# Patient Record
Sex: Female | Born: 1980
Health system: Southern US, Community
[De-identification: ages and names within clinical notes are randomized; demographics above are authoritative.]

## PROBLEM LIST (undated history)

## (undated) ENCOUNTER — Inpatient Hospital Stay (HOSPITAL_COMMUNITY): Payer: Self-pay

## (undated) DIAGNOSIS — N200 Calculus of kidney: Secondary | ICD-10-CM

## (undated) DIAGNOSIS — Z9889 Other specified postprocedural states: Secondary | ICD-10-CM

## (undated) DIAGNOSIS — O039 Complete or unspecified spontaneous abortion without complication: Principal | ICD-10-CM

## (undated) DIAGNOSIS — R112 Nausea with vomiting, unspecified: Secondary | ICD-10-CM

## (undated) DIAGNOSIS — Z3490 Encounter for supervision of normal pregnancy, unspecified, unspecified trimester: Secondary | ICD-10-CM

## (undated) DIAGNOSIS — N739 Female pelvic inflammatory disease, unspecified: Secondary | ICD-10-CM

## (undated) DIAGNOSIS — O468X1 Other antepartum hemorrhage, first trimester: Secondary | ICD-10-CM

## (undated) DIAGNOSIS — R1031 Right lower quadrant pain: Secondary | ICD-10-CM

## (undated) DIAGNOSIS — F419 Anxiety disorder, unspecified: Secondary | ICD-10-CM

## (undated) DIAGNOSIS — J069 Acute upper respiratory infection, unspecified: Secondary | ICD-10-CM

## (undated) DIAGNOSIS — R102 Pelvic and perineal pain: Secondary | ICD-10-CM

## (undated) DIAGNOSIS — O418X10 Other specified disorders of amniotic fluid and membranes, first trimester, not applicable or unspecified: Secondary | ICD-10-CM

## (undated) HISTORY — PX: SHOULDER SURGERY: SHX246

## (undated) HISTORY — DX: Acute upper respiratory infection, unspecified: J06.9

## (undated) HISTORY — DX: Encounter for supervision of normal pregnancy, unspecified, unspecified trimester: Z34.90

## (undated) HISTORY — DX: Other antepartum hemorrhage, first trimester: O46.8X1

## (undated) HISTORY — DX: Other specified disorders of amniotic fluid and membranes, first trimester, not applicable or unspecified: O41.8X10

## (undated) HISTORY — PX: BACK SURGERY: SHX140

## (undated) HISTORY — DX: Female pelvic inflammatory disease, unspecified: N73.9

## (undated) HISTORY — DX: Pelvic and perineal pain: R10.2

## (undated) HISTORY — DX: Complete or unspecified spontaneous abortion without complication: O03.9

## (undated) HISTORY — PX: TONSILLECTOMY: SUR1361

## (undated) HISTORY — DX: Right lower quadrant pain: R10.31

---

## 2000-10-28 ENCOUNTER — Emergency Department (HOSPITAL_COMMUNITY): Admission: EM | Admit: 2000-10-28 | Discharge: 2000-10-28 | Payer: Self-pay | Admitting: *Deleted

## 2001-11-20 ENCOUNTER — Emergency Department (HOSPITAL_COMMUNITY): Admission: EM | Admit: 2001-11-20 | Discharge: 2001-11-21 | Payer: Self-pay | Admitting: Emergency Medicine

## 2002-08-03 ENCOUNTER — Emergency Department (HOSPITAL_COMMUNITY): Admission: EM | Admit: 2002-08-03 | Discharge: 2002-08-03 | Payer: Self-pay | Admitting: Emergency Medicine

## 2003-01-29 ENCOUNTER — Emergency Department (HOSPITAL_COMMUNITY): Admission: EM | Admit: 2003-01-29 | Discharge: 2003-01-29 | Payer: Self-pay | Admitting: Internal Medicine

## 2003-08-10 ENCOUNTER — Ambulatory Visit (HOSPITAL_COMMUNITY): Admission: RE | Admit: 2003-08-10 | Discharge: 2003-08-10 | Payer: Self-pay | Admitting: *Deleted

## 2003-08-31 ENCOUNTER — Observation Stay (HOSPITAL_COMMUNITY): Admission: RE | Admit: 2003-08-31 | Discharge: 2003-09-01 | Payer: Self-pay | Admitting: *Deleted

## 2003-09-07 ENCOUNTER — Ambulatory Visit (HOSPITAL_COMMUNITY): Admission: RE | Admit: 2003-09-07 | Discharge: 2003-09-07 | Payer: Self-pay | Admitting: Internal Medicine

## 2003-10-24 ENCOUNTER — Ambulatory Visit (HOSPITAL_COMMUNITY): Admission: RE | Admit: 2003-10-24 | Discharge: 2003-10-24 | Payer: Self-pay | Admitting: *Deleted

## 2003-11-17 ENCOUNTER — Ambulatory Visit (HOSPITAL_COMMUNITY): Admission: AD | Admit: 2003-11-17 | Discharge: 2003-11-17 | Payer: Self-pay | Admitting: *Deleted

## 2003-11-21 ENCOUNTER — Ambulatory Visit (HOSPITAL_COMMUNITY): Admission: AD | Admit: 2003-11-21 | Discharge: 2003-11-21 | Payer: Self-pay | Admitting: *Deleted

## 2003-11-27 ENCOUNTER — Inpatient Hospital Stay (HOSPITAL_COMMUNITY): Admission: AD | Admit: 2003-11-27 | Discharge: 2003-11-29 | Payer: Self-pay | Admitting: *Deleted

## 2004-05-28 ENCOUNTER — Encounter (HOSPITAL_COMMUNITY): Admission: RE | Admit: 2004-05-28 | Discharge: 2004-06-27 | Payer: Self-pay | Admitting: Neurosurgery

## 2004-07-31 ENCOUNTER — Ambulatory Visit (HOSPITAL_COMMUNITY): Admission: RE | Admit: 2004-07-31 | Discharge: 2004-08-01 | Payer: Self-pay | Admitting: Neurosurgery

## 2004-08-29 ENCOUNTER — Ambulatory Visit (HOSPITAL_COMMUNITY): Admission: RE | Admit: 2004-08-29 | Discharge: 2004-08-29 | Payer: Self-pay | Admitting: Family Medicine

## 2004-12-11 ENCOUNTER — Encounter: Admission: RE | Admit: 2004-12-11 | Discharge: 2004-12-11 | Payer: Self-pay | Admitting: Family Medicine

## 2006-01-24 ENCOUNTER — Ambulatory Visit (HOSPITAL_COMMUNITY): Admission: RE | Admit: 2006-01-24 | Discharge: 2006-01-24 | Payer: Self-pay | Admitting: Family Medicine

## 2007-09-10 ENCOUNTER — Ambulatory Visit (HOSPITAL_COMMUNITY): Admission: RE | Admit: 2007-09-10 | Discharge: 2007-09-10 | Payer: Self-pay | Admitting: Family Medicine

## 2008-03-13 ENCOUNTER — Other Ambulatory Visit: Admission: RE | Admit: 2008-03-13 | Discharge: 2008-03-13 | Payer: Self-pay | Admitting: Obstetrics & Gynecology

## 2008-08-19 ENCOUNTER — Ambulatory Visit: Payer: Self-pay | Admitting: Obstetrics & Gynecology

## 2008-08-19 ENCOUNTER — Inpatient Hospital Stay (HOSPITAL_COMMUNITY): Admission: AD | Admit: 2008-08-19 | Discharge: 2008-08-19 | Payer: Self-pay | Admitting: Obstetrics & Gynecology

## 2008-09-20 ENCOUNTER — Inpatient Hospital Stay (HOSPITAL_COMMUNITY): Admission: RE | Admit: 2008-09-20 | Discharge: 2008-09-22 | Payer: Self-pay | Admitting: Obstetrics & Gynecology

## 2008-09-20 ENCOUNTER — Ambulatory Visit: Payer: Self-pay | Admitting: Family

## 2008-09-28 ENCOUNTER — Inpatient Hospital Stay (HOSPITAL_COMMUNITY): Admission: AD | Admit: 2008-09-28 | Discharge: 2008-09-28 | Payer: Self-pay | Admitting: Obstetrics & Gynecology

## 2009-04-14 ENCOUNTER — Emergency Department (HOSPITAL_COMMUNITY): Admission: EM | Admit: 2009-04-14 | Discharge: 2009-04-14 | Payer: Self-pay | Admitting: Emergency Medicine

## 2009-10-01 ENCOUNTER — Other Ambulatory Visit: Admission: RE | Admit: 2009-10-01 | Discharge: 2009-10-01 | Payer: Self-pay | Admitting: Obstetrics and Gynecology

## 2009-10-18 ENCOUNTER — Emergency Department (HOSPITAL_COMMUNITY): Admission: EM | Admit: 2009-10-18 | Discharge: 2009-10-18 | Payer: Self-pay | Admitting: Emergency Medicine

## 2010-02-04 ENCOUNTER — Ambulatory Visit (HOSPITAL_COMMUNITY): Admission: RE | Admit: 2010-02-04 | Discharge: 2010-02-04 | Payer: Self-pay | Admitting: Family Medicine

## 2010-02-05 ENCOUNTER — Emergency Department (HOSPITAL_COMMUNITY)
Admission: EM | Admit: 2010-02-05 | Discharge: 2010-02-05 | Payer: Self-pay | Source: Home / Self Care | Admitting: Emergency Medicine

## 2010-03-23 ENCOUNTER — Encounter: Payer: Self-pay | Admitting: Family Medicine

## 2010-03-24 ENCOUNTER — Encounter: Payer: Self-pay | Admitting: Family Medicine

## 2010-03-24 ENCOUNTER — Encounter: Payer: Self-pay | Admitting: *Deleted

## 2010-03-25 ENCOUNTER — Encounter: Payer: Self-pay | Admitting: Internal Medicine

## 2010-05-13 LAB — DIFFERENTIAL
Basophils Absolute: 0 10*3/uL (ref 0.0–0.1)
Basophils Relative: 0 % (ref 0–1)
Eosinophils Absolute: 0.2 10*3/uL (ref 0.0–0.7)
Eosinophils Relative: 2 % (ref 0–5)
Lymphocytes Relative: 18 % (ref 12–46)
Lymphs Abs: 1.8 10*3/uL (ref 0.7–4.0)
Monocytes Absolute: 0.5 10*3/uL (ref 0.1–1.0)
Monocytes Relative: 5 % (ref 3–12)
Neutro Abs: 7.5 10*3/uL (ref 1.7–7.7)
Neutrophils Relative %: 75 % (ref 43–77)

## 2010-05-13 LAB — CBC
HCT: 41.5 % (ref 36.0–46.0)
Hemoglobin: 14.8 g/dL (ref 12.0–15.0)
MCH: 30.3 pg (ref 26.0–34.0)
MCHC: 35.7 g/dL (ref 30.0–36.0)
MCV: 85 fL (ref 78.0–100.0)
Platelets: 167 10*3/uL (ref 150–400)
RBC: 4.88 MIL/uL (ref 3.87–5.11)
RDW: 12.8 % (ref 11.5–15.5)
WBC: 10 10*3/uL (ref 4.0–10.5)

## 2010-05-13 LAB — URINALYSIS, ROUTINE W REFLEX MICROSCOPIC
Glucose, UA: NEGATIVE mg/dL
Leukocytes, UA: NEGATIVE
Nitrite: NEGATIVE
Protein, ur: 30 mg/dL — AB
Specific Gravity, Urine: >1.03 — ABNORMAL HIGH (ref 1.005–1.030)
Urobilinogen, UA: 0.2 mg/dL (ref 0.0–1.0)
pH: 5.5 (ref 5.0–8.0)

## 2010-05-13 LAB — BASIC METABOLIC PANEL
BUN: 11 mg/dL (ref 6–23)
CO2: 20 mEq/L (ref 19–32)
Calcium: 8.9 mg/dL (ref 8.4–10.5)
Chloride: 112 mEq/L (ref 96–112)
Creatinine, Ser: 0.92 mg/dL (ref 0.4–1.2)
GFR calc Af Amer: >60 mL/min (ref >60)
GFR calc non Af Amer: >60 mL/min (ref >60)
Glucose, Bld: 111 mg/dL — ABNORMAL HIGH (ref 70–99)
Potassium: 3.8 mEq/L (ref 3.5–5.1)
Sodium: 140 mEq/L (ref 135–145)

## 2010-05-13 LAB — URINE MICROSCOPIC-ADD ON

## 2010-05-13 LAB — PREGNANCY, URINE: Preg Test, Ur: NEGATIVE

## 2010-06-09 LAB — CBC
HCT: 30.4 % — ABNORMAL LOW (ref 36.0–46.0)
HCT: 36.2 % (ref 36.0–46.0)
Hemoglobin: 10.5 g/dL — ABNORMAL LOW (ref 12.0–15.0)
Hemoglobin: 12.5 g/dL (ref 12.0–15.0)
MCHC: 34.5 g/dL (ref 30.0–36.0)
MCHC: 34.5 g/dL (ref 30.0–36.0)
MCV: 89 fL (ref 78.0–100.0)
MCV: 89.8 fL (ref 78.0–100.0)
Platelets: 100 10*3/uL — ABNORMAL LOW (ref 150–400)
Platelets: 128 10*3/uL — ABNORMAL LOW (ref 150–400)
RBC: 3.39 MIL/uL — ABNORMAL LOW (ref 3.87–5.11)
RBC: 4.06 MIL/uL (ref 3.87–5.11)
RDW: 14.6 % (ref 11.5–15.5)
RDW: 14.9 % (ref 11.5–15.5)
WBC: 7.4 10*3/uL (ref 4.0–10.5)
WBC: 9.6 10*3/uL (ref 4.0–10.5)

## 2010-06-09 LAB — CCBB MATERNAL DONOR DRAW

## 2010-06-09 LAB — RPR: RPR Ser Ql: NONREACTIVE

## 2010-07-19 NOTE — H&P (Signed)
NAME:  Lindsay Nichols, HEMMER                           ACCOUNT NO.:  [id number]   MEDICAL RECORD NO.:  [id number]                   PATIENT TYPE:  OIB   LOCATION:  A414                                 FACILITY:  [facility]   PHYSICIAN:  Langley Gauss, M.D.                DATE OF BIRTH:  28-Jan-1981   DATE OF ADMISSION:  11/17/2003  DATE OF DISCHARGE:  11/17/2003                                HISTORY & PHYSICAL   OBSTETRICAL OBSERVATION NOTE   HISTORY OF PRESENT ILLNESS:  The patient is a 30 year old gravida 3, para 1,  [redacted] weeks gestation who presented to [facility] stating that while  driving her child to school today she had contractions, three in a one hour  period.  Due to her prematurity she felt compelled to come for evaluation.  The patient denies any vaginal bleeding or leakage of fluid.  She denies any  change in her vaginal discharge.  She does have some chronic abdominal and  back pain but specifically states that these felt as though they were  menstrual-type cramping.   PAST MEDICAL HISTORY:  The patient's prenatal record is reviewed.  Likewise  her past medical record is likewise reviewed.  Prenatally she was noted to  have been treated for Escherichia coli bladder infection.  She was noted to  have hypoglycemic episodes, panic disorder and prior rotator cuff surgery.  She has had serial ultrasounds which have documented adequate fetal growth  and normal anatomic surgery.   CURRENT MEDICATIONS:  Include only prenatal vitamins.   PHYSICAL EXAMINATION:  GENERAL:  She is noted to be in no acute distress.  VITAL SIGNS:  Temperature 97.9, pulse 100, respiratory rate 20, blood  pressure 116/86.  HEENT:  Negative.  No lymphadenopathy.  Mucous membranes are moist.  NECK:  Supple. Thyroid is nonpalpable.  LUNGS:  Clear.  CARDIOVASCULAR:  Examination has regular rate and rhythm.  ABDOMEN:  Soft, nontender.  Gravid uterus identified with fundal height of  38 cm.  She is vertex  presentation by Leopold's maneuvers.  EXTREMITIES:  Reveal only trace pretibial edema.  PELVIC:  Normal external genitalia.  No lesions or ulcerations are  identified.  No leakage of fluid.  No vaginal bleeding is identified.  Cervix presenting vertex, is noted to be not engaged.  Cervix is 0.5 to 1 cm  dilated, minimally effaced, soft and posterior.   HOSPITAL COURSE:  Nonstress test is ordered.  Fetal heart rate baseline is  145, acceleration is noted greater than 15 beats per minute X greater than  15 seconds duration.  Normal long term variability.  No fetal heart rate  decelerations are noted.  External toco reveals 30 to 45 second contractions  q.12 to 15 minutes which per the patient's subjective history have spaced  out.   ASSESSMENT/PLAN:  36 week intrauterine pregnancy with threatened preterm  labor.  Uterine contractions have now markedly  spaced out documented by what  we see on the external toco as well as patient's subjective history.  Signs and symptoms of labor and spontaneous rupture of membranes are  reviewed with the patient which she will continue with her regular prenatal  care.  She is given a prescription for Lortab 10/500, #30 for chronic  complaints of back and abdominal pain.     ___________________________________________                                         Langley Gauss, M.D.   DC/MEDQ  D:  11/17/2003  T:  11/17/2003  Job:  [id number]

## 2010-07-19 NOTE — Discharge Summary (Signed)
NAME:  Lindsay, Nichols NO.:  [id number]   MEDICAL RECORD NO.:  [id number]                   PATIENT TYPE:  OIB   LOCATION:  A415                                 FACILITY:  [facility]   PHYSICIAN:  Langley Gauss, M.D.                DATE OF BIRTH:  [birth year]   DATE OF ADMISSION:  08/31/2003  DATE OF DISCHARGE:  09/01/2003                                 DISCHARGE SUMMARY   HISTORY OF PRESENT ILLNESS:  The patient is a 30 year old gravida 3, para 1,  1 prior vaginal delivery at 24-6/[redacted] weeks gestation who presents to [facility] via EMS.  When she was sent to [facility], she was on  a backboard with her neck immobilized per EMS staff.  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  I had been  made aware of this during prenatal visits, but I was very adamant in  cautioning patient about her risks associated with this job, most  specifically to include slipping or falling on a wet floor and the  diminished ability to catch her balance due to the ongoing pregnancy.  The  patient states that she was working at [employer] this p.m., was carrying a  tray of glasses in a narrow hallway which had two counters on adjacent  sides.  History is that subsequently she did slip on what others report was  a wet and very slippery floor.  The patient subsequently recalls and was  found by coworkers to be laying on her left side and moaning about her right  hip/buttock area.  The patient does not recall the actual fall, nor was the  actual fall witnessed, but the primary mechanism of fall appears to be the  patient slipping on the floor, falling backwards onto her right buttock hip  area and upon hitting the floor, rolling onto her left side.  Does not seem  as though there was any significant abdominal or pelvic trauma elicited on  this fall, nor does patient give a history of recalling falling onto her  abdominal or pelvic  area.  The patient's prenatal course has otherwise been  uncomplicated to date.  She does have one prior vaginal delivery without  complications.  Her prenatal course and past medical history are reviewed,  and otherwise, felt to be noncontributory.   CURRENT MEDICATIONS:  Prenatal vitamins.   ALLERGIES:  NO KNOWN DRUG ALLERGIES.   PHYSICAL EXAMINATION:  GENERAL APPEARANCE:  Upon her initial arrival, the  patient is noted to be complaining of severe pain, 8-9/10, and she was noted  to be crying at the time of her arrival to Labor and Delivery.  VITAL SIGNS:  Temperature 98.3, pulse 84, respiratory rate 22, blood  pressure 132/74.  After obtaining a history from the patient, specifically regarding the  mechanism of the fall and evaluating  the patient's area of complaint of  injury, the patient was removed from the backboard and neck restraint.  The  neck was supple.  Thyroid was nonpalpable.  No facial injuries noted.  LUNGS:  Clear.  CARDIOVASCULAR:  Regular rate and rhythm.  ABDOMEN:  Soft and nontender.  Gravid uterus identified with fundal height  of 26 cm.  This was marked on the patient's abdomen.  Uterine tone was noted  to be normal.  No apparent bruising is identified in the uterine abdominal  area or the upper body of the patient.  EXTREMITIES:  Normal.  PELVIC:  Deferred.  EXTREMITIES:  The patient is able to produce flexion at the hips with no  significant discomfort increased.  She is complaining of pain in the right  buttock and right sacroiliac area.  On examination, there is noted to be  exquisite tenderness over the sacroiliac area with point tenderness at the  coccyx.   LABORATORY DATA:  Hemoglobin 11.4, hematocrit 32.0, white count 11.0.  __________ was obtained.  Nonstress test requested p.m. of August 31, 2003.  Fetal heart rate was noted to have a base line of 150.  Normal long term  variability for [redacted] weeks gestation.  No accelerations were present which  would  meet the criteria for an acceleration.  However, no fetal heart rate  decelerations are noted.  Nonstress test interpretation August 31, 2003,  nonreactive, but appropriate for [redacted] weeks gestation.   ASSESSMENT/PLAN:  The patient sustaining fall at work, slipping backwards on  a wet floor by best patient history provided as well as review of what was  seen and not seen by her coworkers.  It seems as though she slipped  backwards on her right buttock area and did not sustain any significant  abdominal or uterine trauma.  No evidence of any fetal compromise on  examination at this time.  The patient is to be continued on an observation  status as she is exquisitely tender at present.  She will require some pain  relief for this.  Initially, she was given p.o. Tylox and done very well  with this.  Subsequently, would like to proceed with limited single view x-  ray of the pelvis on September 01, 2003, to evaluate for any evidence of displaced  fracture.   HOSPITAL COURSE:  The patient did well throughout the evening receiving p.o.  Tylox for pain relief.  She, in addition, was allowed to have a regular  diet.  Subsequently, on September 01, 2003, a single shot AP of the pelvis was  performed which did not reveal any evidence of any displaced fracture.  The  fetus was noted to be in a vertex presentation.  Nonstress test is again  performed on September 01, 2003, which again is nonreactive but appropriate for [redacted]  weeks gestation with no fetal heart rate decelerations noted.  No evidence  of uterine activity.  The patient is feeling much better on the date of September 01, 2003.  Much less emotional regarding the fall and its possible fetal  implications.  Thus, the patient is discharged to home on September 01, 2003.   DISCHARGE MEDICATIONS:  Tylox for pain relief.   DISCHARGE INSTRUCTIONS:  She is to restrict her activity to modified bed rest and will be out of work for an undetermined period of time pending her   symptomatic improvement.     ___________________________________________  Langley Gauss, M.D.   DC/MEDQ  D:  09/06/2003  T:  09/06/2003  Job:  [id number]

## 2010-07-19 NOTE — H&P (Signed)
NAME:  Lindsay Nichols, Lindsay Nichols NO.:  [id number]   MEDICAL RECORD NO.:  [id number]          PATIENT TYPE:  OIB   LOCATION:  LDR1                          FACILITY:  [facility]   PHYSICIAN:  Langley Gauss, M.D.   DATE OF BIRTH:  1980/07/04   DATE OF ADMISSION:  11/27/2003  DATE OF DISCHARGE:  LH                                HISTORY & PHYSICAL   HISTORY OF PRESENT ILLNESS:  The patient is a 30 year old gravida 3, para 1  at 38-2/[redacted] weeks gestation based upon a certain last menstrual period.  This  was confirmed by 13 week and 21 week ultrasounds.  The patient is seen in  the office after her mother called stating that the patient had increasing  strength, intensity and frequency of uterine contractions today and appeared  to be uncomfortable.  The patient does correlate that she has had irregular  uterine contractions previously but on today's date she has begun  experiencing pelvic pressure as well as more consistent tightening.  She was  seen in labor and delivery nine days previously at which time she was noted  to be not in labor.  Her prenatal course has otherwise been uncomplicated.  She is GBS carrier status negative.  She had a glucose tolerance test done  in 2000 which was normal at 109.  She is A positive blood type, antibody  screen negative, hepatitis negative, rubella immune.  Hemoglobin 11.6.   PAST MEDICAL HISTORY:  History of panic disorder.  History of hypoglycemic  episodes.  History of prior tonsillectomy.  Prior rotator cuff surgery.  Previously she has taken Paxil and Zoloft, Wellbutrin and Xanax for panic  disorder.  During the pregnancy she has done well in the absence of any of  these medications.   OBSTETRICAL HISTORY:  The patient has had serial ultrasounds which have  documented adequate fetal growth with normal anatomic survey.  Her history  reveals she underwent a vaginal delivery on May 17, 1998 of a 5 pound, 4  ounce infant.  She had actually  excellent epidural block in place with a  short second stage of labor.  She did have a greater than 100,000  Escherichia coli which was treated successfully with Macrodantin.   SOCIAL HISTORY:  She has smoked 1 to 1.5 packs cigarettes per day at onset  of pregnancy.  Father of baby is Luz Lex, works for [employer].  He apparently is not involved in the pregnancy.   PHYSICAL EXAMINATION:  GENERAL:  No acute distress.  VITAL SIGNS:  Patient is 5 feet 7-1/2 inches, pre pregnancy weight 197,  today 228.  Blood pressure 141/80. Pulse 80.  Respiratory rate 20.  HEENT: Negative. No adenopathy.  NECK:  Supple.  Thyroid is non palpable.  LUNGS:  Clear to auscultation.  CARDIOVASCULAR:  Regular rate and rhythm.  ABDOMEN:  Soft and nontender.  No surgical scars are identified.  She has a  vertex presentation by Leopold's maneuvers  EXTREMITIES:  Reveal 1+ pretibial edema.  PELVIC:  Normal external genitalia, no lesions or ulcerations identified.  No vaginal  bleeding and no leakage of fluid.  Cervix vertex presentation,  confirmed, vertex is well applied to the cervix, cervix is noted to be very  far posterior but I was able to reach it.  She is 3 cm dilated, 60% effaced,  and minus 1 station.   ASSESSMENT:  38-2/7 weeks intrauterine pregnancy complaining of uterine  contractions today.  She has made cervical change as compared to previous  examination nine days previously. She is referred to labor and delivery at  this time to evaluate for the intensity of uterine contractions and to  ascertain whether she does make cervical change.  She is, as previously  stated, group Beta Strep carrier status negative.      DC/MEDQ  D:  11/27/2003  T:  11/27/2003  Job:  [id number]

## 2010-07-19 NOTE — Op Note (Signed)
NAMEMarland Kitchen  Lindsay Nichols, Lindsay Nichols NO.:  [id number]   MEDICAL RECORD NO.:  [id number]          PATIENT TYPE:  OIB   LOCATION:  2899                         FACILITY:  [facility]   PHYSICIAN:  Hewitt Shorts, M.D.DATE OF BIRTH:  1980-09-13   DATE OF PROCEDURE:  07/31/2004  DATE OF DISCHARGE:                                 OPERATIVE REPORT   PREOPERATIVE DIAGNOSIS:  Right L5-S1 lumbar disk herniation, lumbar  degenerative disk disease, lumbar spondylosis and lumbar radiculopathy.   POSTOPERATIVE DIAGNOSIS:  Right L5-S1 lumbar disk herniation, lumbar  degenerative disk disease, lumbar spondylosis and lumbar radiculopathy.   OPERATION PERFORMED:  Right L5-S1 lumbar laminotomy and microdiskectomy with  microdissection.   SURGEON:  Hewitt Shorts, M.D.   ASSISTANT:  Payton Doughty, M.D.   ANESTHESIA:  General endotracheal.   INDICATIONS FOR PROCEDURE:  The patient is a 30 year old, who has had right  lumbar radiculopathy secondary to a right L5-S1 lumbar disk herniation.  The  patient has undergone a series of spinal injections without lasting relief  and is therefore brought to surgery for decompression.   DESCRIPTION OF PROCEDURE:  The patient was brought to the operating room and  placed under general endotracheal anesthesia.  The patient was turned to a  prone position.  Lumbar region was prepped with Betadine soap and solution  and draped in sterile fashion.  The midline was infiltrated with local  anesthetic with epinephrine.  X-ray was taken to localize the L5-S1 level  and then a midline incision was made over the L5-S1 level and carried down  to the subcutaneous tissue.  Bipolar cautery and electrocautery were used to  maintain hemostasis.  Dissection was carried down to the lumbar fascia which  was incised on the left side of the midline in the paraspinal muscles.  We  dissected the spinous process and lamina in subperiosteal fashion.  The  L5-  S1 level was  identified and confirmed by x-ray and then the microscope was  draped and brought into the field to provide additional magnification,  illumination and visualization and the remainder of the decompression was  performed using microdissection and microsurgical technique. A laminotomy  was performed using the X-Max drill and Kerrison punches.  The ligamentum  flavum was carefully resected and we identified the annulus and the right  paramedian subligamentous disk herniation.  The overlying annulus was  incised and the disk fragment removed. We removed additional loose  degenerative disk material from within the disk space and in the end a  thorough diskectomy was performed removing all loose fragments of disk  material from the disk space and the epidural space and we achieved good  decompression of the thecal sac and exiting right S1 nerve root.  Once the  diskectomy was completed, hemostasis was established with the use of bipolar  cautery. Once hemostasis was established and confirmed, we instilled 2 mL of  fentanyl and 80 mg of Depo-Medrol into the epidural space and then we  proceeded with closure.  The paraspinal muscles were reapproximated with  interrupted undyed #1 Vicryl sutures, the  deep subcutaneous was closed with  undyed interrupted inverted #1 Vicryl sutures.  The subcutaneous and  subcuticular layer were closed with interrupted inverted 2-0 and 3-0 undyed  Vicryl sutures and skin edges were approximated with Dermabond.  The wound  was dressed with Adaptic and sterile gauze.  The patient tolerated the  procedure well.  The estimated  blood loss for this procedure was 25 mL.  Sponge, needle and instrument  counts were correct.  Following surgery the patient was turned back to  supine position to be reversed from anesthetic, extubated and transferred to  recovery room for further care.      RWN/MEDQ  D:  07/31/2004  T:  07/31/2004  Job:  [id number]

## 2010-07-19 NOTE — Op Note (Signed)
NAME:  Lindsay Nichols, Lindsay Nichols NO.:  [id number]   MEDICAL RECORD NO.:  [id number]          PATIENT TYPE:  INP   LOCATION:  MLAB                          FACILITY:  [facility]   PHYSICIAN:  Langley Gauss, M.D.   DATE OF BIRTH:  April 18, 1980   DATE OF PROCEDURE:  11/28/2003  DATE OF DISCHARGE:                                 OPERATIVE REPORT   DIAGNOSIS:  A 38-1/2 week intrauterine pregnancy presenting in labor.   DELIVERY PERFORMED:  1.  Spontaneous assisted vaginal delivery, viable vigorous female infant.  2.  Repair of a first degree perineal laceration.   OBSTETRICIAN:  Delivery was performed by Dr. Langley Gauss.   ESTIMATED BLOOD LOSS:  Less than 500 mL.   SPECIMENS:  Arterial cord gas and cord blood to pathology laboratory.  The  placenta was examined and noted to be apparently intact with a three-vessel  umbilical cord.   ANESTHESIA:  The patient had continuous lumbar epidural placed during the  course of labor to facilitate the repair of the perineum, 10 mL of 1%  lidocaine plain was placed.   SUMMARY:  Patient seen in the office on November 27, 2003 complaining of  increased frequency and intensity of uterine contractions.  She was referred  to [facility] for evaluation.  The patient is monitored by external  fetal monitor.  She is noted to have a reassuring fetal heart rate.  Serial  examinations are performed as the patient is observed for the onset of  labor.  She is noted to have documented cervical change at which time she is  determined to be in labor.  Amniotomy was performed at that time with  findings of clear amniotic fluid.  Fetal scalp electrode was placed which  documents a reassuring fetal heart rate.  After amniotomy, patient entered  active adequate labor.  The patient received IV Nubain and Phenergan for  pain relief after which time continuous lumber epidural analgesia was  placed.  The epidural functioned very well but the patient  subsequently  progressed rapidly through active  phase of labor at which time she had  significant perineal and pelvic pressure.  She was placed in dorsal  lithotomy position and examined was noted to be completely dilated with a  vertex at a +2 station.  The patient was prepped utilizing Calgon solution.  She pushed well during her short second stage of labor, milking the perineum  was performed.  The infant was noted to descend very well, did have some  moderate variable decelerations during the second stage of labor, and was  noted to deliver in a direct OA position.  Excellent perineal support was  provided.  The mouth and nares were bulb suctioned of clear amniotic fluid.  Spontaneous rotation then occurred to a right anterior shoulder position.  Gentle downward traction combined with expulsive efforts resulted in  delivery of the anterior shoulder over the pubic symphysis as well as the  remainder of the body of the infant.  Spontaneous and vigorous breath and  cry was noted.  The vocal cord was  then milked toward the infant.  The cord  was then doubly clamped and cut, the infant was placed on the maternal  abdomen for immediate bonding purposes.  Arterial cord gas and cord blood  were then obtained.  Gentle traction on the umbilical cord resulted in  separation of what appeared to be an intact three-vessel umbilical cord with  attached intact placenta.  Fundal massage was then performed to achieve  adequate uterine tone and minimize bleeding.  Examination of genital tract  at this time revealed intact cervix, intact vaginal sidewalls and  periurethral area.  First degree perineal laceration was noted which  required repair.  Total of 10 mL 1% lidocaine plain was utilized.  The  laceration was then repaired utilizing 0 chromic in a running fashion.  A  total of three sutures were placed to reapproximate the normal anatomy.  The  patient was then taken out of dorsal lithotomy position  and rolled to her  side at which time the epidural catheter was removed with blue tip noted to  be intact.  Note the patient does plan on trying to breast feed.  She would  like to utilize [facility] for subsequent newborn pediatric care.  During this  hospitalization she will be utilizing pediatrician on call.       ___________________________________________  Langley Gauss, M.D.    DC/MEDQ  D:  11/28/2003  T:  11/29/2003  Job:  [id number]

## 2010-07-19 NOTE — Discharge Summary (Signed)
NAMETOBIE, PERDUE NO.:  [id number]   MEDICAL RECORD NO.:  [id number]          PATIENT TYPE:  INP   LOCATION:  A412                          FACILITY:  [facility]   PHYSICIAN:  Langley Gauss, M.D.   DATE OF BIRTH:  1981/02/25   DATE OF ADMISSION:  11/27/2003  DATE OF DISCHARGE:  09/28/2005LH                                 DISCHARGE SUMMARY   Initial observation started on November 27, 2003.  When the patient was  determined to be in labor, she was admitted.   PROCEDURE:  1.  Admission, November 28, 2003.  2.  Epidural, November 28, 2003.  3.  Vaginal delivery 6 pound 14 ounce female infant, on November 28, 2003,      with repair of a first degree perineal laceration.   DATE OF DISCHARGE:  November 29, 2003.   The patient was given a copy of the standard discharge instructions.  She is  breast-feeding at the time of discharge.  She would like to utilize oral  contraceptives for birth control purposes.  She will, thus, follow up in the  office in four weeks' time.   DISCHARGE MEDICATIONS:  None.   Pediatric care provided one week postpartum check by [facility].   PERTINENT LABORATORY STUDIES:  Admission hemoglobin hematocrit 11.3/32.8  with a white count of 8.6.  Postpartum day #1, 11.1/31.4 with a white count  of 9.6.  Group B strep carrier status is noted to be negative.   HOSPITAL COURSE:  The patient at 38-2/7ths weeks' gestation.  Seen in the  office, on November 27, 2003, complaining of increased frequency and  intensity of uterine contractions.  Referred to [facility] for  further evaluation.  She was initially placed on observation status on  November 27, 2003.  The patient placed on external fetal monitor.  Uterine  contractions are monitored.  In addition, serial examinations of the cervix  are performed throughout the night and early a.m. on November 28, 2003.  The patient is noted to be in early stages of active labor, thus  she is  admitted on November 28, 2003.  Amniotomy performed.  Fetal scalp electrode  placed.  Clear amniotic fluid noted.  The patient requested epidural during  the course of active labor.  This was placed without difficulty by Dr.  Roylene Reason. Lisette Grinder.  Delivery performed, on November 28, 2003, without  difficulty.  Postpartum, the patient did well. She has breast fed without difficulty.  Bonding well with the infant.  Socially, it appears though the father of the  baby has been in to visit with the patient on today's date of service  November 29, 2003.  Thus, the patient is now discharged to home on  November 29, 2003.      DC/MEDQ  D:  11/29/2003  T:  11/30/2003  Job:  [id number]   cc:   [facility]

## 2010-07-19 NOTE — H&P (Signed)
NAME:  Lindsay Nichols, Lindsay Nichols NO.:  [id number]   MEDICAL RECORD NO.:  [id number]          PATIENT TYPE:  INP   LOCATION:                                FACILITY:  [facility]   PHYSICIAN:  Langley Gauss, M.D.   DATE OF BIRTH:  Dec 31, 1980   DATE OF ADMISSION:  11/28/2003  DATE OF DISCHARGE:  LH                                HISTORY & PHYSICAL   HISTORY OF PRESENT ILLNESS:  The patient is a gravida 3, para 1, at 38-3/[redacted]  weeks gestation who is seen in the office afternoon of November 27, 2003  complaining of chronic low back pain.  However, on top of this she also had  complaints of increased pelvic pressure as well as uterine tightening and  loosening consistent with uterine contractions.  She was referred to [facility] for evaluation.  Examination in the office at that time had  revealed her to be a very tight 3 cm dilated, -1 station, very far posterior  position of the cervix.  The vertex was well applied.  The patient was  evaluated in [facility] labor and delivery at which time vital  signs were stable.  Chart was reviewed where trivial negative group B strep  carrier status.  Pertinently she had positive group B strep bacturia with  her previous labor and delivery but has stated previously, her group B strep  carrier was negative during this gestation.  During the observation period  initially, the patient was noted to have episodes of uterine irritability  followed by episodes of irregular uterine contractions with some  interspersed episodes of uterine contractions occurring every three to five  minutes.  She was allowed to ambulate and then would be monitored for  increased frequency and intensity of uterine contractions.  Throughout the  night, she continued to have these uterine contractions which she described  as being somewhat difficult to time their exact frequency due to the  significant lower lumbar back pain that she was already experiencing.   She  was monitored throughout the night.  She had minimal sleep.  She was noted  to make cervical change to 4 cm dilated, 80% effaced, 0 station, vertex well  applied with palpable intact bulging membranes.  Uterine contractions at  that time, a.m. of November 28, 2003 revealed uterine contractions every  three to five minutes.  This in association with the documented cervical  change.  The patient was determined to be in early stages of labor; thus she  was admitted.   Group B strep carrier status is negative.  Amniotomy is performed utilizing  a fetal scalp electrode.  Clear amniotic fluid is noted.   ASSESSMENT/PLAN:  1.  Intrauterine pregnancy, 38-2/7 weeks.  2.  Early stages of active labor.  Will monitor for adequacy of labor and      subsequently augment with Pitocin only if clinically indicated.  The      patient has had epidural placed with her previous labor and delivery and      would like to have epidural analgesic  likewise placed during this labor      and delivery.   The father of the baby, Luz Lex, is not expected to be present during  the labor and delivery.  The patient would like to try breast feeding and  she would like to use Yasmin for birth control purposes postpartum.  Subsequent newborn pediatric care to be provided by [facility].       ___________________________________________  Langley Gauss, M.D.    DC/MEDQ  D:  11/28/2003  T:  11/28/2003  Job:  [id number]

## 2010-07-19 NOTE — Op Note (Signed)
NAMEMarland Kitchen  RICA, HEATHER NO.:  [id number]   MEDICAL RECORD NO.:  [id number]          PATIENT TYPE:  INP   LOCATION:  LDR1                          FACILITY:  [facility]   PHYSICIAN:  Langley Gauss, M.D.   DATE OF BIRTH:  12-02-1980   DATE OF PROCEDURE:  11/28/2003  DATE OF DISCHARGE:                                 OPERATIVE REPORT   PROCEDURE:  Placement of continuous lumbar epidural analgesia at the L2-3  interspace, performed by Langley Gauss, M.D.   COMPLICATIONS:  None.   SPECIMENS:  None.   SUMMARY:  An appropriate informed consent was obtained.  The patient had had  an epidural placed with her previous labor and delivery.  Continuous  electronic fetal monitoring was performed.  The patient was placed in a  seated position, at which time the bony landmarks were identified.  The L3-4  interspace was chosen.  The patient's back was sterilely prepped and draped  utilizing the epidural tray, 5 mL 1% lidocaine plain is placed at the  midline of the L3-4 interspace to raise a small skin wheal.  The 17-gauge  Tuohy-Schliff needle is then utilized with loss of resistance in an air-  filled glass syringe to identify entry into the epidural space on the first  attempt without difficulty.  Initial test dose 5 mL 1.5% lidocaine plus  epinephrine injected through the epidural needle, no signs of CSF or  intravascular injection obtained.  Thus, the epidural catheter is inserted  to a depth of 5 cm.  Epidural needle is removed.  Aspiration test is  negative.  A second test dose of 2 mL 1.5% lidocaine plus epinephrine  injected through the epidural catheter.  Again no signs of CSF or  intravascular injection obtained.  The catheter is secured into place.  The  patient is returned to the left lateral supine position, at which time she  does have evidence of an early setting-up epidural block.  The catheter is  secured into place, patient is connected to the infusion pump with  the  standard mixture.  She will be treated with a bolus of 10 mL followed by a  continuous infusion rate of 14 mL/hr.  Vital signs remain stable.  Fetal  heart rate remains reassuring.      DC/MEDQ  D:  11/28/2003  T:  11/28/2003  Job:  [id number]

## 2010-07-19 NOTE — Op Note (Signed)
[name], Lindsay Nichols NO.:  [id number]   MEDICAL RECORD NO.:  [id number]          PATIENT TYPE:  INP   LOCATION:  LDR1                          FACILITY:  [facility]   PHYSICIAN:  Langley Gauss, M.D.   DATE OF BIRTH:  November 13, 1980   DATE OF PROCEDURE:  11/28/2003  DATE OF DISCHARGE:                                 OPERATIVE REPORT   DELIVERY NOTE:  1.  Delivery a spontaneous assisted vaginal delivery of a 6 pound 14 ounce      female infant.  2.  Repair of a first degree perineal laceration.   Delivery performed by Langley Gauss, M.D.   Estimated blood loss less than 500 mL.   Complications:  None.   Specimens:  Arterial cord gas and cord blood to laboratory.  The placenta  was examined and noted to be apparently intact with a three-vessel umbilical  cord.      DC/MEDQ  D:  11/28/2003  T:  11/28/2003  Job:  [id number]

## 2010-10-03 ENCOUNTER — Other Ambulatory Visit: Payer: Self-pay | Admitting: Adult Health

## 2010-10-03 ENCOUNTER — Other Ambulatory Visit (HOSPITAL_COMMUNITY)
Admission: RE | Admit: 2010-10-03 | Discharge: 2010-10-03 | Disposition: A | Discharge status: Home / Self Care | Payer: BC Managed Care – PPO | Source: Ambulatory Visit | Attending: Obstetrics and Gynecology | Admitting: Obstetrics and Gynecology

## 2010-10-03 DIAGNOSIS — Z124 Encounter for screening for malignant neoplasm of cervix: Secondary | ICD-10-CM | POA: Insufficient documentation

## 2010-10-03 DIAGNOSIS — Z113 Encounter for screening for infections with a predominantly sexual mode of transmission: Secondary | ICD-10-CM | POA: Insufficient documentation

## 2010-10-14 ENCOUNTER — Ambulatory Visit (HOSPITAL_COMMUNITY)
Admission: RE | Admit: 2010-10-14 | Discharge: 2010-10-14 | Disposition: A | Discharge status: Home / Self Care | Payer: BC Managed Care – PPO | Source: Ambulatory Visit | Attending: Internal Medicine | Admitting: Internal Medicine

## 2010-10-14 ENCOUNTER — Encounter (HOSPITAL_COMMUNITY): Payer: Self-pay

## 2010-10-14 ENCOUNTER — Other Ambulatory Visit (HOSPITAL_COMMUNITY): Payer: Self-pay | Admitting: Internal Medicine

## 2010-10-14 DIAGNOSIS — M545 Low back pain, unspecified: Secondary | ICD-10-CM | POA: Insufficient documentation

## 2010-10-14 DIAGNOSIS — N200 Calculus of kidney: Secondary | ICD-10-CM

## 2010-10-14 DIAGNOSIS — N390 Urinary tract infection, site not specified: Secondary | ICD-10-CM

## 2010-10-16 ENCOUNTER — Emergency Department (HOSPITAL_COMMUNITY): Payer: BC Managed Care – PPO

## 2010-10-16 ENCOUNTER — Emergency Department (HOSPITAL_COMMUNITY)
Admission: EM | Admit: 2010-10-16 | Discharge: 2010-10-16 | Disposition: A | Discharge status: Home / Self Care | Payer: BC Managed Care – PPO | Attending: Emergency Medicine | Admitting: Emergency Medicine

## 2010-10-16 ENCOUNTER — Encounter (HOSPITAL_COMMUNITY): Payer: Self-pay

## 2010-10-16 DIAGNOSIS — M545 Low back pain, unspecified: Secondary | ICD-10-CM | POA: Insufficient documentation

## 2010-10-16 DIAGNOSIS — N12 Tubulo-interstitial nephritis, not specified as acute or chronic: Secondary | ICD-10-CM

## 2010-10-16 HISTORY — DX: Anxiety disorder, unspecified: F41.9

## 2010-10-16 HISTORY — DX: Calculus of kidney: N20.0

## 2010-10-16 LAB — BASIC METABOLIC PANEL
BUN: 9 mg/dL (ref 6–23)
CO2: 19 mEq/L (ref 19–32)
Calcium: 9.2 mg/dL (ref 8.4–10.5)
Chloride: 103 mEq/L (ref 96–112)
Creatinine, Ser: 0.69 mg/dL (ref 0.50–1.10)
GFR calc Af Amer: >60 mL/min (ref >60)
GFR calc non Af Amer: >60 mL/min (ref >60)
Glucose, Bld: 87 mg/dL (ref 70–99)
Potassium: 3.6 mEq/L (ref 3.5–5.1)
Sodium: 135 mEq/L (ref 135–145)

## 2010-10-16 LAB — URINALYSIS, ROUTINE W REFLEX MICROSCOPIC
Glucose, UA: NEGATIVE mg/dL
Ketones, ur: 15 mg/dL — AB
Nitrite: POSITIVE — AB
Protein, ur: 30 mg/dL — AB
Specific Gravity, Urine: 1.02 (ref 1.005–1.030)
Urobilinogen, UA: 0.2 mg/dL (ref 0.0–1.0)
pH: 6 (ref 5.0–8.0)

## 2010-10-16 LAB — URINE MICROSCOPIC-ADD ON

## 2010-10-16 LAB — CBC
HCT: 38.9 % (ref 36.0–46.0)
Hemoglobin: 13.2 g/dL (ref 12.0–15.0)
MCH: 29.5 pg (ref 26.0–34.0)
MCHC: 33.9 g/dL (ref 30.0–36.0)
MCV: 87 fL (ref 78.0–100.0)
Platelets: 130 10*3/uL — ABNORMAL LOW (ref 150–400)
RBC: 4.47 MIL/uL (ref 3.87–5.11)
RDW: 13.4 % (ref 11.5–15.5)
WBC: 9.2 10*3/uL (ref 4.0–10.5)

## 2010-10-16 LAB — PREGNANCY, URINE: Preg Test, Ur: NEGATIVE

## 2010-10-16 MED ORDER — HYDROMORPHONE HCL 1 MG/ML IJ SOLN
1.0000 mg | Freq: Once | INTRAMUSCULAR | Status: AC
  Administered 2010-10-16: 1 mg via INTRAVENOUS
  Filled 2010-10-16: qty 1

## 2010-10-16 MED ORDER — FENTANYL CITRATE 0.05 MG/ML IJ SOLN
100.0000 ug | Freq: Once | INTRAMUSCULAR | Status: AC
  Administered 2010-10-16: 100 ug via INTRAVENOUS
  Filled 2010-10-16: qty 2

## 2010-10-16 MED ORDER — DIPHENHYDRAMINE HCL 50 MG/ML IJ SOLN
12.5000 mg | Freq: Once | INTRAMUSCULAR | Status: AC
  Administered 2010-10-16: 14:00:00 via INTRAVENOUS
  Filled 2010-10-16: qty 1

## 2010-10-16 MED ORDER — ONDANSETRON HCL 4 MG/2ML IJ SOLN
4.0000 mg | Freq: Once | INTRAMUSCULAR | Status: AC
  Administered 2010-10-16: 4 mg via INTRAVENOUS
  Filled 2010-10-16: qty 2

## 2010-10-16 MED ORDER — SODIUM CHLORIDE 0.9 % IV SOLN
Freq: Once | INTRAVENOUS | Status: AC
  Administered 2010-10-16: 12:00:00 via INTRAVENOUS

## 2010-10-16 MED ORDER — DEXTROSE 5 % IV SOLN
1.0000 g | Freq: Once | INTRAVENOUS | Status: AC
  Administered 2010-10-16: 1 g via INTRAVENOUS
  Filled 2010-10-16: qty 1

## 2010-10-16 MED ORDER — HYDROMORPHONE HCL 4 MG PO TABS: 4.0000 mg | ORAL_TABLET | Freq: Four times a day (QID) | ORAL | Status: AC | PRN

## 2010-10-16 NOTE — Progress Notes (Signed)
Pt tolerated iv antibiotics without problem. Pain improved after IV narcotic pain med. Plan discussed. Pt to return if not improving or if any problem.

## 2010-10-16 NOTE — Progress Notes (Signed)
Test results given to pt. Plan discussed. Pt states she got little relief from the pain meds. Will try additional medication.

## 2010-10-16 NOTE — ED Provider Notes (Signed)
History     CSN: [id number] Arrival date & time: 10/16/2010 11:19 AM  Chief Complaint  Patient presents with  . Urinary Tract Infection   Patient is a 30 y.o. female presenting with urinary tract infection. The history is provided by the patient.  Urinary Tract Infection This is a new problem. The current episode started in the past 7 days. The problem occurs constantly. The problem has been gradually worsening. Associated symptoms include abdominal pain, anorexia, chills, fatigue, a fever, headaches, myalgias, nausea and urinary symptoms. Pertinent negatives include no arthralgias, chest pain, coughing or neck pain. The symptoms are aggravated by nothing. She has tried lying down and oral narcotics for the symptoms. The treatment provided no relief.    Past Medical History  Diagnosis Date  . Kidney stone   . Anxiety     Past Surgical History  Procedure Date  . Tonsillectomy   . Back surgery   . Shoulder surgery     No family history on file.  History  Substance Use Topics  . Smoking status: Current Everyday Smoker  . Smokeless tobacco: Not on file  . Alcohol Use: No    OB History    Grav Para Term Preterm Abortions TAB SAB Ect Mult Living                  Review of Systems  Constitutional: Positive for fever, chills and fatigue. Negative for activity change.       All ROS Neg except as noted in HPI  HENT: Negative for nosebleeds and neck pain.   Eyes: Negative for photophobia and discharge.  Respiratory: Negative for cough, shortness of breath and wheezing.   Cardiovascular: Negative for chest pain and palpitations.  Gastrointestinal: Positive for nausea, abdominal pain and anorexia. Negative for blood in stool.  Genitourinary: Negative for dysuria, frequency and hematuria.  Musculoskeletal: Positive for myalgias. Negative for back pain and arthralgias.  Skin: Negative.   Neurological: Positive for headaches. Negative for dizziness, seizures and speech difficulty.   Psychiatric/Behavioral: Negative for hallucinations and confusion.    Physical Exam  BP 135/74  Pulse 126  Temp(Src) 99.4 F (37.4 C) (Oral)  Resp 20  Ht 5' 6.5" (1.689 m)  Wt 246 lb (111.585 kg)  BMI 39.11 kg/m2  SpO2 100%  Physical Exam  Nursing note and vitals reviewed. Constitutional: She is oriented to person, place, and time. She appears well-developed and well-nourished.  Non-toxic appearance.  HENT:  Head: Normocephalic.  Right Ear: Tympanic membrane and external ear normal.  Left Ear: Tympanic membrane and external ear normal.  Eyes: EOM and lids are normal. Pupils are equal, round, and reactive to light.  Neck: Normal range of motion. Neck supple. Carotid bruit is not present.  Cardiovascular: Intact distal pulses and normal pulses.  Tachycardia present.   Pulmonary/Chest: Breath sounds normal. No respiratory distress.  Abdominal: Soft. Bowel sounds are normal. She exhibits no mass. There is tenderness.  Genitourinary:       Bilat CVA tenderness.  Musculoskeletal: Normal range of motion.  Lymphadenopathy:       Head (right side): No submandibular adenopathy present.       Head (left side): No submandibular adenopathy present.    She has no cervical adenopathy.  Neurological: She is alert and oriented to person, place, and time. She has normal strength. No cranial nerve deficit or sensory deficit.  Skin: Skin is warm and dry.  Psychiatric: She has a normal mood and affect. Her speech is normal.  ED Course  Procedures  MDM I have reviewed nursing notes, vital signs, and all appropriate lab and imaging results for this patient.      Kathie Dike, [place] 10/21/10 Rickey Primus

## 2010-10-16 NOTE — ED Notes (Signed)
A&Ox3. Skin w/p/d. Resp even and unlabored. Pt complaining of lower back pain. States she is currently being treated with abx for UTI. States she doesn't think treament is working. States symptoms are worsening.

## 2010-10-16 NOTE — ED Notes (Signed)
Pt c/o R side pain since last week.   Says now pain moving to left side.  Reports high fever, dry mouth.  Says has scant amount of urine.  Says has been voiding 3 times during the night and maybe twice during the day.  Pt taking cipro since Monday and percocet.  Was previously on macrobid but says wasn't working,

## 2010-10-18 LAB — URINE CULTURE
Colony Count: >=100000
Culture  Setup Time: 201208151335

## 2010-11-07 NOTE — ED Provider Notes (Signed)
Medical screening examination/treatment/procedure(s) were performed by non-physician practitioner and as supervising physician I was immediately available for consultation/collaboration.   Shelda Jakes, MD 11/07/10 [phone]

## 2010-11-23 NOTE — Progress Notes (Signed)
  Medical screening examination/treatment/procedure(s) were performed by non-physician practitioner and as supervising physician I was immediately available for consultation/collaboration.     

## 2011-06-19 ENCOUNTER — Ambulatory Visit (HOSPITAL_COMMUNITY)
Admission: RE | Admit: 2011-06-19 | Discharge: 2011-06-19 | Disposition: A | Discharge status: Home / Self Care | Payer: BC Managed Care – PPO | Source: Ambulatory Visit | Attending: Family Medicine | Admitting: Family Medicine

## 2011-06-19 ENCOUNTER — Other Ambulatory Visit (HOSPITAL_COMMUNITY): Payer: Self-pay | Admitting: Family Medicine

## 2011-06-19 DIAGNOSIS — R51 Headache: Secondary | ICD-10-CM | POA: Insufficient documentation

## 2011-06-19 DIAGNOSIS — R42 Dizziness and giddiness: Secondary | ICD-10-CM | POA: Insufficient documentation

## 2012-07-09 ENCOUNTER — Telehealth: Payer: Self-pay | Admitting: Adult Health

## 2012-07-09 MED ORDER — NORETHIN-ETH ESTRAD-FE BIPHAS 1 MG-10 MCG / 10 MCG PO TABS: 1.0000 | ORAL_TABLET | Freq: Every day | ORAL | Status: DC

## 2012-07-09 NOTE — Telephone Encounter (Signed)
Pt complains in hair changes like losing curls and feels dry and dead has cut hair 2 times. Will finish up sprintec and start lo loesrtin this Sunday, use condoms x 1 pack of pills.Lo loestrin filled x 1 year.

## 2012-07-12 ENCOUNTER — Telehealth: Payer: Self-pay | Admitting: Adult Health

## 2012-07-12 MED ORDER — MEDROXYPROGESTERONE ACETATE 150 MG/ML IM SUSP: 150.0000 mg | INTRAMUSCULAR | Status: DC

## 2012-07-12 NOTE — Telephone Encounter (Signed)
Wants to back to Depo, OCs too expensive, wiil call in to [facility]

## 2012-07-21 ENCOUNTER — Telehealth: Payer: Self-pay | Admitting: Adult Health

## 2012-07-21 NOTE — Telephone Encounter (Signed)
Left message to call in am  

## 2012-07-22 NOTE — Telephone Encounter (Signed)
Will come in 5/27 for TSH and thyroid

## 2012-08-27 ENCOUNTER — Emergency Department (HOSPITAL_COMMUNITY): Payer: 59

## 2012-08-27 ENCOUNTER — Encounter (HOSPITAL_COMMUNITY): Payer: Self-pay | Admitting: Emergency Medicine

## 2012-08-27 ENCOUNTER — Emergency Department (HOSPITAL_COMMUNITY)
Admission: EM | Admit: 2012-08-27 | Discharge: 2012-08-27 | Disposition: A | Discharge status: Home / Self Care | Payer: 59 | Attending: Emergency Medicine | Admitting: Emergency Medicine

## 2012-08-27 DIAGNOSIS — R51 Headache: Secondary | ICD-10-CM | POA: Insufficient documentation

## 2012-08-27 DIAGNOSIS — Z79899 Other long term (current) drug therapy: Secondary | ICD-10-CM | POA: Insufficient documentation

## 2012-08-27 DIAGNOSIS — R42 Dizziness and giddiness: Secondary | ICD-10-CM | POA: Insufficient documentation

## 2012-08-27 DIAGNOSIS — B9789 Other viral agents as the cause of diseases classified elsewhere: Secondary | ICD-10-CM | POA: Insufficient documentation

## 2012-08-27 DIAGNOSIS — R509 Fever, unspecified: Secondary | ICD-10-CM | POA: Insufficient documentation

## 2012-08-27 DIAGNOSIS — Z87891 Personal history of nicotine dependence: Secondary | ICD-10-CM | POA: Insufficient documentation

## 2012-08-27 DIAGNOSIS — R110 Nausea: Secondary | ICD-10-CM | POA: Insufficient documentation

## 2012-08-27 DIAGNOSIS — M436 Torticollis: Secondary | ICD-10-CM | POA: Insufficient documentation

## 2012-08-27 DIAGNOSIS — R519 Headache, unspecified: Secondary | ICD-10-CM

## 2012-08-27 DIAGNOSIS — D72819 Decreased white blood cell count, unspecified: Secondary | ICD-10-CM | POA: Insufficient documentation

## 2012-08-27 DIAGNOSIS — B349 Viral infection, unspecified: Secondary | ICD-10-CM

## 2012-08-27 DIAGNOSIS — Z87442 Personal history of urinary calculi: Secondary | ICD-10-CM | POA: Insufficient documentation

## 2012-08-27 LAB — URINE MICROSCOPIC-ADD ON

## 2012-08-27 LAB — CBC WITH DIFFERENTIAL/PLATELET
Basophils Absolute: 0 10*3/uL (ref 0.0–0.1)
Basophils Relative: 0 % (ref 0–1)
Eosinophils Absolute: 0 10*3/uL (ref 0.0–0.7)
Eosinophils Relative: 0 % (ref 0–5)
HCT: 40.6 % (ref 36.0–46.0)
Hemoglobin: 13.6 g/dL (ref 12.0–15.0)
Lymphocytes Relative: 13 % (ref 12–46)
Lymphs Abs: 0.3 10*3/uL — ABNORMAL LOW (ref 0.7–4.0)
MCH: 29.1 pg (ref 26.0–34.0)
MCHC: 33.5 g/dL (ref 30.0–36.0)
MCV: 86.8 fL (ref 78.0–100.0)
Monocytes Absolute: 0.3 10*3/uL (ref 0.1–1.0)
Monocytes Relative: 15 % — ABNORMAL HIGH (ref 3–12)
Neutro Abs: 1.7 10*3/uL (ref 1.7–7.7)
Neutrophils Relative %: 73 % (ref 43–77)
Platelets: 123 10*3/uL — ABNORMAL LOW (ref 150–400)
RBC: 4.68 MIL/uL (ref 3.87–5.11)
RDW: 13.1 % (ref 11.5–15.5)
WBC: 2.3 10*3/uL — ABNORMAL LOW (ref 4.0–10.5)

## 2012-08-27 LAB — COMPREHENSIVE METABOLIC PANEL
ALT: 22 U/L (ref 0–35)
AST: 15 U/L (ref 0–37)
Albumin: 3.9 g/dL (ref 3.5–5.2)
Alkaline Phosphatase: 86 U/L (ref 39–117)
BUN: 10 mg/dL (ref 6–23)
CO2: 24 mEq/L (ref 19–32)
Calcium: 9.2 mg/dL (ref 8.4–10.5)
Chloride: 103 mEq/L (ref 96–112)
Creatinine, Ser: 0.77 mg/dL (ref 0.50–1.10)
GFR calc Af Amer: >90 mL/min (ref >90)
GFR calc non Af Amer: >90 mL/min (ref >90)
Glucose, Bld: 84 mg/dL (ref 70–99)
Potassium: 3.7 mEq/L (ref 3.5–5.1)
Sodium: 137 mEq/L (ref 135–145)
Total Bilirubin: 1.3 mg/dL — ABNORMAL HIGH (ref 0.3–1.2)
Total Protein: 7.5 g/dL (ref 6.0–8.3)

## 2012-08-27 LAB — CSF CELL COUNT WITH DIFFERENTIAL
RBC Count, CSF: 0 /mm3
RBC Count, CSF: 1 /mm3 — ABNORMAL HIGH
Tube #: 1
Tube #: 4
WBC, CSF: 1 /mm3 (ref 0–5)
WBC, CSF: 1 /mm3 (ref 0–5)

## 2012-08-27 LAB — PROTEIN AND GLUCOSE, CSF
Glucose, CSF: 47 mg/dL (ref 43–76)
Total  Protein, CSF: 30 mg/dL (ref 15–45)

## 2012-08-27 LAB — URINALYSIS, ROUTINE W REFLEX MICROSCOPIC
Bilirubin Urine: NEGATIVE
Glucose, UA: NEGATIVE mg/dL
Ketones, ur: NEGATIVE mg/dL
Leukocytes, UA: NEGATIVE
Nitrite: NEGATIVE
Protein, ur: NEGATIVE mg/dL
Specific Gravity, Urine: 1.015 (ref 1.005–1.030)
Urobilinogen, UA: 0.2 mg/dL (ref 0.0–1.0)
pH: 7 (ref 5.0–8.0)

## 2012-08-27 MED ORDER — HYDROCODONE-ACETAMINOPHEN 5-325 MG PO TABS: 1.0000 | ORAL_TABLET | Freq: Four times a day (QID) | ORAL | Status: DC | PRN

## 2012-08-27 MED ORDER — LORAZEPAM 2 MG/ML IJ SOLN
1.0000 mg | Freq: Once | INTRAMUSCULAR | Status: AC
  Administered 2012-08-27: 1 mg via INTRAVENOUS
  Filled 2012-08-27: qty 1

## 2012-08-27 MED ORDER — HYDROMORPHONE HCL PF 1 MG/ML IJ SOLN
1.0000 mg | Freq: Once | INTRAMUSCULAR | Status: AC
  Administered 2012-08-27: 1 mg via INTRAVENOUS
  Filled 2012-08-27 (×2): qty 1

## 2012-08-27 MED ORDER — ONDANSETRON HCL 4 MG/2ML IJ SOLN
4.0000 mg | Freq: Once | INTRAMUSCULAR | Status: AC
  Administered 2012-08-27: 4 mg via INTRAVENOUS
  Filled 2012-08-27: qty 2

## 2012-08-27 NOTE — ED Notes (Signed)
Pt refused in and out.  Clean catch obtained instead.

## 2012-08-27 NOTE — ED Notes (Signed)
Reports relief of headache.  No relief of neck pain.  Still c/o 7/10 neck pain.  Refusing any other pain meds at this time.  nad noted.

## 2012-08-27 NOTE — Procedures (Signed)
Preprocedure Dx: Headache, fever, stiff neck Postprocedure Dx: Headache, fever, stiff neck Procedure:  Fluoroscopically guided lumbar puncture Radiologist:  Tyron Russell Anesthesia:  3 ml of 1% lidocaine Specimen:  8 ml CSF, clear colorless EBL:   None Opening pressure: 20 mm H2O Complications: None

## 2012-08-27 NOTE — ED Provider Notes (Signed)
History    This chart was scribed for Benny Lennert, MD, by Frederik Pear, ED scribe. The patient was seen in room APA10/APA10 and the patient's care was started at 1046.   CSN: [id number] Arrival date & time 08/27/12  1020  First MD Initiated Contact with Patient 08/27/12 1046     Chief Complaint  Patient presents with  . Headache  . Torticollis  . Fever   (Consider location/radiation/quality/duration/timing/severity/associated sxs/prior Treatment) Patient is a 32 y.o. female presenting with general illness. The history is provided by the patient and medical records. No language interpreter was used.  Illness Onset quality:  Sudden Duration:  5 days Progression:  Worsening Chronicity:  New Context:  Dizziness Associated symptoms: fever, headaches and nausea   Associated symptoms: no abdominal pain, no chest pain, no congestion, no cough, no diarrhea, no fatigue and no rash    HPI Comments: Lindsay Nichols is a 32 y.o. female who presents to the Emergency Department complaining of sudden onset, intermittent dizziness that began 5 days with an associated fever that spiked at 101. In ED, her temperature is 99.7. She denies emesis, cough, and sore throat. She states that the dizziness lasted intermittently for 2 days and then resolved until last night when it returned. Since then, the symptom has been constant with associated moderate posterior neck pain and stiffness that extends up to the occipital skull, HA, and nausea. She reports that she was seen 4 days ago by Dr. Sherwood Gambler and sent to the ED after emailing him this morning to rule out Meningitis. 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 She has no chronic medical conditions that she require daily medications. She currently takes Depo-Provera.  Past Medical History  Diagnosis Date  . Kidney stone   . Anxiety    Past Surgical History  Procedure Laterality Date  .  Tonsillectomy    . Back surgery    . Shoulder surgery     No family history on file. History  Substance Use Topics  . Smoking status: Former Games developer  . Smokeless tobacco: Not on file  . Alcohol Use: No   OB History   Grav Para Term Preterm Abortions TAB SAB Ect Mult Living                 Review of Systems  Constitutional: Positive for fever. Negative for appetite change and fatigue.  HENT: Negative for congestion, sinus pressure and ear discharge.   Eyes: Negative for discharge.  Respiratory: Negative for cough.   Cardiovascular: Negative for chest pain.  Gastrointestinal: Positive for nausea. Negative for abdominal pain and diarrhea.  Genitourinary: Negative for frequency and hematuria.  Musculoskeletal: Negative for back pain.  Skin: Negative for rash.  Neurological: Positive for headaches. Negative for seizures.  Psychiatric/Behavioral: Negative for hallucinations.    Allergies  Review of patient's allergies indicates no known allergies.  Home Medications   Current Outpatient Rx  Name  Route  Sig  Dispense  Refill  . acetaminophen (TYLENOL) 500 MG chewable tablet   Oral   Chew 500 mg by mouth every 6 (six) hours as needed for pain. For fever         . ibuprofen (ADVIL,MOTRIN) 200 MG tablet   Oral   Take 200 mg by mouth every 6 (six) hours as needed. For fever          . medroxyPROGESTERone (DEPO-PROVERA) 150 MG/ML injection  Intramuscular   Inject 1 mL (150 mg total) into the muscle every 3 (three) months.   1 mL   0   . Multiple Vitamins-Minerals (MULTI FOR HER PO)   Oral   Take 1 tablet by mouth at bedtime.          BP 132/70  Pulse 119  Temp(Src) 99.7 F (37.6 C)  Resp 20  Ht 5\' 7"  (1.702 m)  Wt 260 lb (117.935 kg)  BMI 40.71 kg/m2  SpO2 99% Physical Exam  Nursing note and vitals reviewed. Constitutional: She is oriented to person, place, and time. She appears well-developed.  HENT:  Head: Normocephalic.  Eyes: Conjunctivae and EOM  are normal. No scleral icterus.  Neck: No thyromegaly present.  Mild stiffness and tenderness to posterior neck.  Cardiovascular: Normal rate and regular rhythm.  Exam reveals no gallop and no friction rub.   No murmur heard. Pulmonary/Chest: No stridor. She has no wheezes. She has no rales. She exhibits no tenderness.  Abdominal: She exhibits no distension. There is no tenderness. There is no rebound.  Musculoskeletal: Normal range of motion. She exhibits no edema.  Lymphadenopathy:    She has no cervical adenopathy.  Neurological: She is oriented to person, place, and time. Coordination normal.  Skin: No rash noted. No erythema.  Psychiatric: She has a normal mood and affect. Her behavior is normal.    ED Course  Procedures (including critical care time)  DIAGNOSTIC STUDIES: Oxygen Saturation is 99% on room air, normal by my interpretation.    COORDINATION OF CARE:  10:50- Discussed planned course of treatment with the patient, including Dilaudid, Zofran, head CT without contrast, UA with microscopic add on, UA with reflex microscopic, CBC with differential, and comprehensive metabolic panel, who is agreeable at this time.  11:00- Medication Orders- hydromorphone (dilaudid) injection 1 mg- once, ondansetron (zofran) injection 4 mg- once.  12:17- Recheck- She reports that her HA has resolved, but is still complaining of neck pain. She states that she turned off the lights because they were hurting her eyes. She still complains of the sensation that something is inside her neck. Pt agreed to lumbar puncture.   12:30- Medication Orders- lorazepam (ativan) injection 1 mg- once.  Results for orders placed during the hospital encounter of 08/27/12  CBC WITH DIFFERENTIAL      Result Value Range   WBC 2.3 (*) 4.0 - 10.5 K/uL   RBC 4.68  3.87 - 5.11 MIL/uL   Hemoglobin 13.6  12.0 - 15.0 g/dL   HCT 16.1  09.6 - 04.5 %   MCV 86.8  78.0 - 100.0 fL   MCH 29.1  26.0 - 34.0 pg   MCHC 33.5   30.0 - 36.0 g/dL   RDW 40.9  81.1 - 91.4 %   Platelets 123 (*) 150 - 400 K/uL   Neutrophils Relative % 73  43 - 77 %   Neutro Abs 1.7  1.7 - 7.7 K/uL   Lymphocytes Relative 13  12 - 46 %   Lymphs Abs 0.3 (*) 0.7 - 4.0 K/uL   Monocytes Relative 15 (*) 3 - 12 %   Monocytes Absolute 0.3  0.1 - 1.0 K/uL   Eosinophils Relative 0  0 - 5 %   Eosinophils Absolute 0.0  0.0 - 0.7 K/uL   Basophils Relative 0  0 - 1 %   Basophils Absolute 0.0  0.0 - 0.1 K/uL  COMPREHENSIVE METABOLIC PANEL      Result Value Range  Sodium 137  135 - 145 mEq/L   Potassium 3.7  3.5 - 5.1 mEq/L   Chloride 103  96 - 112 mEq/L   CO2 24  19 - 32 mEq/L   Glucose, Bld 84  70 - 99 mg/dL   BUN 10  6 - 23 mg/dL   Creatinine, Ser 1.61  0.50 - 1.10 mg/dL   Calcium 9.2  8.4 - 09.6 mg/dL   Total Protein 7.5  6.0 - 8.3 g/dL   Albumin 3.9  3.5 - 5.2 g/dL   AST 15  0 - 37 U/L   ALT 22  0 - 35 U/L   Alkaline Phosphatase 86  39 - 117 U/L   Total Bilirubin 1.3 (*) 0.3 - 1.2 mg/dL   GFR calc non Af Amer >90  >90 mL/min   GFR calc Af Amer >90  >90 mL/min  URINALYSIS, ROUTINE W REFLEX MICROSCOPIC      Result Value Range   Color, Urine YELLOW  YELLOW   APPearance CLEAR  CLEAR   Specific Gravity, Urine 1.015  1.005 - 1.030   pH 7.0  5.0 - 8.0   Glucose, UA NEGATIVE  NEGATIVE mg/dL   Hgb urine dipstick TRACE (*) NEGATIVE   Bilirubin Urine NEGATIVE  NEGATIVE   Ketones, ur NEGATIVE  NEGATIVE mg/dL   Protein, ur NEGATIVE  NEGATIVE mg/dL   Urobilinogen, UA 0.2  0.0 - 1.0 mg/dL   Nitrite NEGATIVE  NEGATIVE   Leukocytes, UA NEGATIVE  NEGATIVE  URINE MICROSCOPIC-ADD ON      Result Value Range   Squamous Epithelial / LPF FEW (*) RARE   RBC / HPF 0-2  <3 RBC/hpf   Bacteria, UA MANY (*) RARE   Labs Reviewed  CBC WITH DIFFERENTIAL - Abnormal; Notable for the following:    WBC 2.3 (*)    Platelets 123 (*)    Lymphs Abs 0.3 (*)    Monocytes Relative 15 (*)    All other components within normal limits  COMPREHENSIVE METABOLIC  PANEL - Abnormal; Notable for the following:    Total Bilirubin 1.3 (*)    All other components within normal limits  URINALYSIS, ROUTINE W REFLEX MICROSCOPIC - Abnormal; Notable for the following:    Hgb urine dipstick TRACE (*)    All other components within normal limits  URINE MICROSCOPIC-ADD ON - Abnormal; Notable for the following:    Squamous Epithelial / LPF FEW (*)    Bacteria, UA MANY (*)    All other components within normal limits  CSF CULTURE  CSF CELL COUNT WITH DIFFERENTIAL  PROTEIN AND GLUCOSE, CSF  CSF CELL COUNT WITH DIFFERENTIAL   Ct Head Wo Contrast  08/27/2012   *RADIOLOGY REPORT*  Clinical Data: Sudden onset of headache.  Left-sided neck pain. Torticollis.  Fever.  Dizziness.  CT HEAD WITHOUT CONTRAST  Technique:  Contiguous axial images were obtained from the base of the skull through the vertex without contrast.  Comparison: CT scan dated 06/19/2011  Findings: There is no acute intracranial hemorrhage, infarction, or mass lesion.  Brain parenchyma is normal.  Osseous structures are normal.  IMPRESSION: Normal exam.   Original Report Authenticated By: Francene Boyers, M.D.   Dg Lumbar Puncture Fluoro Guide  08/27/2012   *RADIOLOGY REPORT*  Clinical Data: Headache and neck pain, dizziness, double vision, nausea, fever  FLUOROSCOPIC GUIDED LUMBAR PUNCTURE:  Technique: Written informed consent for fluoroscopic-guided lumbar puncture was obtained. Patient was placed prone. L4-L5 disc space was localized under fluoroscopy. Skin  prepped and draped in usual sterile fashion. Skin and soft tissues anesthetized with 3 ml of 1% lidocaine. 22 gauge spinal needle advanced into spinal canal under fluoroscopic guidance. A cross-table lateral view of the lumbar spine was obtained. Clear colorless CSF was encountered with opening pressure of 20 cm H2O. 8 ml of CSF was obtained in four tubes for requested laboratory analysis. Procedure tolerated well by patient without immediate complication.   Fluoroscopy time: 0 minutes 48 seconds  IMPRESSION: Fluoroscopic guided lumbar puncture as above. Fluid sent to laboratory for requested analysis.   Original Report Authenticated By: Ulyses Southward, M.D.   No diagnosis found.  MDM  Csf normal.  tx for viral infection and have pt follow up  ..j    The chart was scribed for me under my direct supervision.  I personally performed the history, physical, and medical decision making and all procedures in the evaluation of this patient.Benny Lennert, MD 08/27/12 1515

## 2012-08-27 NOTE — ED Notes (Signed)
Pt initially refused dilaudid med stating, "I don't like the way it makes me feel."  After going to bathroom and RN wasting med, pt requesting to have it due to head pain.  Med given.

## 2012-08-27 NOTE — ED Notes (Signed)
Pt c/o dizziness since Sunday with n/ha/neck pain/double vision/fever since last night. Pt sent by Dr.Fusco to r/o meningitis.

## 2012-08-31 LAB — CSF CULTURE W GRAM STAIN
Culture: NO GROWTH
Gram Stain: NONE SEEN

## 2012-10-05 ENCOUNTER — Telehealth: Payer: Self-pay | Admitting: Adult Health

## 2012-10-05 MED ORDER — MEDROXYPROGESTERONE ACETATE 150 MG/ML IM SUSP: 150.0000 mg | INTRAMUSCULAR | Status: DC

## 2012-10-05 NOTE — Telephone Encounter (Signed)
Wants depo refilled, will do

## 2013-05-16 ENCOUNTER — Other Ambulatory Visit (HOSPITAL_COMMUNITY): Payer: Self-pay | Admitting: Physician Assistant

## 2013-05-16 ENCOUNTER — Other Ambulatory Visit (HOSPITAL_COMMUNITY): Payer: BC Managed Care – PPO

## 2013-05-16 DIAGNOSIS — H579 Unspecified disorder of eye and adnexa: Secondary | ICD-10-CM

## 2013-05-16 DIAGNOSIS — Z8489 Family history of other specified conditions: Secondary | ICD-10-CM

## 2013-05-16 DIAGNOSIS — R51 Headache: Secondary | ICD-10-CM

## 2013-05-19 ENCOUNTER — Ambulatory Visit (HOSPITAL_COMMUNITY)
Admission: RE | Admit: 2013-05-19 | Discharge: 2013-05-19 | Disposition: A | Discharge status: Home / Self Care | Payer: 59 | Source: Ambulatory Visit | Attending: Physician Assistant | Admitting: Physician Assistant

## 2013-05-19 DIAGNOSIS — Z8489 Family history of other specified conditions: Secondary | ICD-10-CM

## 2013-05-19 DIAGNOSIS — R930 Abnormal findings on diagnostic imaging of skull and head, not elsewhere classified: Secondary | ICD-10-CM | POA: Insufficient documentation

## 2013-05-19 DIAGNOSIS — R51 Headache: Secondary | ICD-10-CM

## 2013-05-19 DIAGNOSIS — H579 Unspecified disorder of eye and adnexa: Secondary | ICD-10-CM

## 2013-05-20 ENCOUNTER — Other Ambulatory Visit: Payer: Self-pay | Admitting: Family Medicine

## 2013-05-20 DIAGNOSIS — L03211 Cellulitis of face: Principal | ICD-10-CM

## 2013-05-20 DIAGNOSIS — L0201 Cutaneous abscess of face: Secondary | ICD-10-CM

## 2013-05-20 MED ORDER — SULFAMETHOXAZOLE-TMP DS 800-160 MG PO TABS: 1.0000 | ORAL_TABLET | Freq: Two times a day (BID) | ORAL | Status: DC

## 2013-05-20 NOTE — Progress Notes (Signed)
Pt is a nurse in our clinic. Asked me this morning to take a look at her ear as it has been hurting and she has a painfulk bump below her earlobe, both on the right. No systemic sx. Warm compresses have been helping.  TM looks fine although i can only visualize the top 2/3 due to wax. No signs of OM though. No drainage. Just inferioposteriorly to earlobe, well circumscribed pea sized firm mass which is mobile. No fluctuance. Erythema on top, but difficult to ascertain if this is from lesion or pt pressing on it.   A/P - inflamed LN vs infected dermoid cyst. Ive asked her to take abx and use warm compresses qid fr the next few days. Use sweet oil to try to soften out the cerumen and for otalgia. Reassess on Monday. ED if acutely worse over the weekend. Will use bactrim for mrsa coverage.

## 2013-06-22 ENCOUNTER — Other Ambulatory Visit: Payer: Self-pay | Admitting: Occupational Medicine

## 2013-06-22 ENCOUNTER — Ambulatory Visit: Payer: Self-pay

## 2013-06-22 DIAGNOSIS — R52 Pain, unspecified: Secondary | ICD-10-CM

## 2013-06-22 DIAGNOSIS — M25562 Pain in left knee: Secondary | ICD-10-CM

## 2013-10-24 ENCOUNTER — Other Ambulatory Visit: Payer: Self-pay | Admitting: Adult Health

## 2013-10-27 ENCOUNTER — Telehealth: Payer: Self-pay | Admitting: Adult Health

## 2013-10-27 ENCOUNTER — Ambulatory Visit (INDEPENDENT_AMBULATORY_CARE_PROVIDER_SITE_OTHER): Payer: Commercial Indemnity | Admitting: Adult Health

## 2013-10-27 ENCOUNTER — Encounter: Payer: Self-pay | Admitting: Adult Health

## 2013-10-27 VITALS — BP 104/68 | Temp 98.2°F | Ht 68.5 in | Wt 268.5 lb

## 2013-10-27 DIAGNOSIS — Z3202 Encounter for pregnancy test, result negative: Secondary | ICD-10-CM

## 2013-10-27 DIAGNOSIS — Z3009 Encounter for other general counseling and advice on contraception: Secondary | ICD-10-CM

## 2013-10-27 DIAGNOSIS — Z30011 Encounter for initial prescription of contraceptive pills: Secondary | ICD-10-CM

## 2013-10-27 DIAGNOSIS — Z309 Encounter for contraceptive management, unspecified: Secondary | ICD-10-CM | POA: Insufficient documentation

## 2013-10-27 DIAGNOSIS — N739 Female pelvic inflammatory disease, unspecified: Secondary | ICD-10-CM

## 2013-10-27 LAB — CBC
HCT: 40.8 % (ref 36.0–46.0)
Hemoglobin: 14.1 g/dL (ref 12.0–15.0)
MCH: 28.8 pg (ref 26.0–34.0)
MCHC: 34.6 g/dL (ref 30.0–36.0)
MCV: 83.4 fL (ref 78.0–100.0)
Platelets: 201 10*3/uL (ref 150–400)
RBC: 4.89 MIL/uL (ref 3.87–5.11)
RDW: 13.8 % (ref 11.5–15.5)
WBC: 6.7 10*3/uL (ref 4.0–10.5)

## 2013-10-27 LAB — POCT WET PREP (WET MOUNT): WBC, Wet Prep HPF POC: POSITIVE

## 2013-10-27 LAB — POCT URINE PREGNANCY: Preg Test, Ur: NEGATIVE

## 2013-10-27 MED ORDER — NORETHIN ACE-ETH ESTRAD-FE 1-20 MG-MCG(24) PO CHEW: 1.0000 | CHEWABLE_TABLET | Freq: Every day | ORAL | Status: DC

## 2013-10-27 MED ORDER — AZITHROMYCIN 500 MG PO TABS: 500.0000 mg | ORAL_TABLET | Freq: Every day | ORAL | Status: DC

## 2013-10-27 MED ORDER — METRONIDAZOLE 500 MG PO TABS: 500.0000 mg | ORAL_TABLET | Freq: Two times a day (BID) | ORAL | Status: DC

## 2013-10-27 MED ORDER — CEFTRIAXONE SODIUM 1 G IJ SOLR
250.0000 mg | Freq: Once | INTRAMUSCULAR | Status: AC
  Administered 2013-10-27: 250 mg via INTRAMUSCULAR

## 2013-10-27 MED ORDER — PROMETHAZINE HCL 25 MG PO TABS: 25.0000 mg | ORAL_TABLET | Freq: Four times a day (QID) | ORAL | Status: DC | PRN

## 2013-10-27 NOTE — Patient Instructions (Signed)
Pelvic Inflammatory Disease °Pelvic inflammatory disease (PID) refers to an infection in some or all of the female organs. The infection can be in the uterus, ovaries, fallopian tubes, or the surrounding tissues in the pelvis. PID can cause abdominal or pelvic pain that comes on suddenly (acute pelvic pain). PID is a serious infection because it can lead to lasting (chronic) pelvic pain or the inability to have children (infertile).  °CAUSES  °The infection is often caused by the normal bacteria found in the vaginal tissues. PID may also be caused by an infection that is spread during sexual contact. PID can also occur following:  °· The birth of a baby.   °· A miscarriage.   °· An abortion.   °· Major pelvic surgery.   °· The use of an intrauterine device (IUD).   °· A sexual assault.   °RISK FACTORS °Certain factors can put a person at higher risk for PID, such as: °· Being younger than 25 years. °· Being sexually active at a young age. °· Using nonbarrier contraception. °· Having multiple sexual partners. °· Having sex with someone who has symptoms of a genital infection. °· Using oral contraception. °Other times, certain behaviors can increase the possibility of getting PID, such as: °· Having sex during your period. °· Using a vaginal douche. °· Having an intrauterine device (IUD) in place. °SYMPTOMS  °· Abdominal or pelvic pain.   °· Fever.   °· Chills.   °· Abnormal vaginal discharge. °· Abnormal uterine bleeding.   °· Unusual pain shortly after finishing your period. °DIAGNOSIS  °Your caregiver will choose some of the following methods to make a diagnosis, such as:  °· Performing a physical exam and history. A pelvic exam typically reveals a very tender uterus and surrounding pelvis.   °· Ordering laboratory tests including a pregnancy test, blood tests, and urine test.  °· Ordering cultures of the vagina and cervix to check for a sexually transmitted infection (STI). °· Performing an ultrasound.    °· Performing a laparoscopic procedure to look inside the pelvis.   °TREATMENT  °· Antibiotic medicines may be prescribed and taken by mouth.   °· Sexual partners may be treated when the infection is caused by a sexually transmitted disease (STD).   °· Hospitalization may be needed to give antibiotics intravenously. °· Surgery may be needed, but this is rare. °It may take weeks until you are completely well. If you are diagnosed with PID, you should also be checked for human immunodeficiency virus (HIV).   °HOME CARE INSTRUCTIONS  °· If given, take your antibiotics as directed. Finish the medicine even if you start to feel better.   °· Only take over-the-counter or prescription medicines for pain, discomfort, or fever as directed by your caregiver.   °· Do not have sexual intercourse until treatment is completed or as directed by your caregiver. If PID is confirmed, your recent sexual partner(s) will need treatment.   °· Keep your follow-up appointments. °SEEK MEDICAL CARE IF:  °· You have increased or abnormal vaginal discharge.   °· You need prescription medicine for your pain.   °· You vomit.   °· You cannot take your medicines.   °· Your partner has an STD.   °SEEK IMMEDIATE MEDICAL CARE IF:  °· You have a fever.   °· You have increased abdominal or pelvic pain.   °· You have chills.   °· You have pain when you urinate.   °· You are not better after 72 hours following treatment.   °MAKE SURE YOU:  °· Understand these instructions. °· Will watch your condition. °· Will get help right away if you are not doing well or get worse. °  Document Released: 02/17/2005 Document Revised: 06/14/2012 Document Reviewed: 02/13/2011 [facility] Patient Information 2015 [place], [place]. This information is not intended to replace advice given to you by your health care provider. Make sure you discuss any questions you have with your health care provider. Take meds  Follow up in 5 days Start OCs Sunday

## 2013-10-27 NOTE — Telephone Encounter (Signed)
Has pelvic pain and burning, thinks has infection, to come in 4 pm to be seen

## 2013-10-27 NOTE — Progress Notes (Signed)
Subjective:     Patient ID: Lindsay Nichols, female   DOB: 1980-04-16, 33 y.o.   MRN: [id number]  HPI [name] is a 33 year old white female in complaining of pelvic pain x 24 hours with nausea and fatigue.Has had PID 2 x in last 2 years.  Review of Systems See HPI Reviewed past medical,surgical, social and family history. Reviewed medications and allergies.     Objective:   Physical Exam BP 104/68  Temp(Src) 98.2 F (36.8 C)  Ht 5' 8.5" (1.74 m)  Wt 268 lb 8 oz (121.791 kg)  BMI 40.23 kg/m2UPT negative, Skin warm and dry.Pelvic: external genitalia is normal in appearance, vagina: white discharge with odor, cervix:smooth and bulbous, +CMT uterus: normal size, shape and contour,  tender, no masses felt, adnexa: no masses, some tenderness noted across pelvic area, Wet prep: + for clue cells and +WBCs. GC/CHL obtained.    Declines pain meds, discussed with Dr Elonda Husky. Received Rocephin 250 mg IM in office, waited 20 minutes.  Assessment:     PID  Contraceptive management    Plan:    Check CBC,CMP and ESR GC/CHL sent Rx azithromycin 500 mg #2 2 po now Rx flagyl 500 mg #20 1 bid x 10 days Rx phenergan 25 mg # 30 1 every 6 hours prn nausea with 1 refill Rx minastrin disp 1 pack take 1 daily with 11 refills Return in 5 days in follow up  If increase in pain go to ER

## 2013-10-28 ENCOUNTER — Telehealth: Payer: Self-pay | Admitting: Adult Health

## 2013-10-28 LAB — COMPREHENSIVE METABOLIC PANEL
ALT: 17 U/L (ref 0–35)
AST: 14 U/L (ref 0–37)
Albumin: 4.1 g/dL (ref 3.5–5.2)
Alkaline Phosphatase: 83 U/L (ref 39–117)
BUN: 6 mg/dL (ref 6–23)
CO2: 22 mEq/L (ref 19–32)
Calcium: 8.6 mg/dL (ref 8.4–10.5)
Chloride: 110 mEq/L (ref 96–112)
Creat: 0.66 mg/dL (ref 0.50–1.10)
Glucose, Bld: 115 mg/dL — ABNORMAL HIGH (ref 70–99)
Potassium: 3.6 mEq/L (ref 3.5–5.3)
Sodium: 141 mEq/L (ref 135–145)
Total Bilirubin: 1 mg/dL (ref 0.2–1.2)
Total Protein: 6.7 g/dL (ref 6.0–8.3)

## 2013-10-28 LAB — GC/CHLAMYDIA PROBE AMP
CT Probe RNA: NEGATIVE
GC Probe RNA: NEGATIVE

## 2013-10-28 LAB — SEDIMENTATION RATE: Sed Rate: 17 mm/hr (ref 0–22)

## 2013-10-28 NOTE — Telephone Encounter (Signed)
Pt aware of labs, continue meds and keep appt, still with some pain

## 2013-11-01 ENCOUNTER — Other Ambulatory Visit (INDEPENDENT_AMBULATORY_CARE_PROVIDER_SITE_OTHER): Payer: Commercial Indemnity

## 2013-11-01 ENCOUNTER — Encounter: Payer: Self-pay | Admitting: Adult Health

## 2013-11-01 ENCOUNTER — Ambulatory Visit (INDEPENDENT_AMBULATORY_CARE_PROVIDER_SITE_OTHER): Payer: Commercial Indemnity | Admitting: Adult Health

## 2013-11-01 VITALS — BP 100/80 | Temp 97.5°F | Ht 68.0 in | Wt 270.5 lb

## 2013-11-01 DIAGNOSIS — R1031 Right lower quadrant pain: Secondary | ICD-10-CM

## 2013-11-01 HISTORY — DX: Right lower quadrant pain: R10.31

## 2013-11-01 NOTE — Progress Notes (Signed)
Subjective:     Patient ID: Lindsay Nichols, female   DOB: 1980/04/28, 33 y.o.   MRN: [id number]  HPI Delphina is a 33 year old white female back in follow up of PID, was treated with rocephin,azitrimycin and flagyl last week and she is better but has more pain in RLQ now than before.  Review of Systems See HPI Reviewed past medical,surgical, social and family history. Reviewed medications and allergies.     Objective:   Physical Exam BP 100/80  Temp(Src) 97.5 F (36.4 C)  Ht $R'5\' 8"'XT$  (1.727 m)  Wt 270 lb 8 oz (122.698 kg)  BMI 41.14 kg/m2 She had labs last week that was ESR 17,WBC 6.7 and GC/CHL negative,Skin warm and dry.Pelvic: external genitalia is normal in appearance, vagina: no lesions,good color, moisture and rugae,, cervix:smooth and bulbous,-CMT, uterus: normal size, shape and contour, non tender, no masses felt, adnexa: no masses, has  tenderness RLQ, will get [place] now in office.Reviewed [place]:   Uterus 6.3 x 4.8 x 3.9 cm, anteverted  Endometrium 4.2 mm, symmetrical,  Right ovary 2.9 x 1.7 x 1.2 cm,  Left ovary 2.3 x 1.6 x 1.5 cm,  No free fluid or adnexal masses noted  *Patient tender during entire exam but noticed increased pain with pressure applied to Rt adnexa  Technician Comments:  Anteverted uterus, Endom-4.39mm, bilateral adnexa appears WNL, no free fluid or adnexal masses noted although patient noted increased tenderness Rt Adnexa  Discussed with Dr Elonda Husky, will follow for now   Assessment:    RLQ pain Recently treated for PID    Plan:    Call prn  Follow up prn If increase in pain or fever or vomiting go to ER

## 2013-11-01 NOTE — Patient Instructions (Signed)
If increase in pain fever or vomiting go to ER Follow up prn if not better next week call me

## 2013-12-16 ENCOUNTER — Other Ambulatory Visit: Payer: Self-pay

## 2014-01-02 ENCOUNTER — Encounter: Payer: Self-pay | Admitting: Adult Health

## 2014-01-31 ENCOUNTER — Telehealth: Payer: Self-pay | Admitting: Adult Health

## 2014-01-31 MED ORDER — NORGESTIMATE-ETH ESTRADIOL 0.25-35 MG-MCG PO TABS: 1.0000 | ORAL_TABLET | Freq: Every day | ORAL | Status: DC

## 2014-01-31 NOTE — Telephone Encounter (Signed)
Wants OCs(minastrin) changed due to cost, will rx sprintec with 6 refills

## 2014-05-18 ENCOUNTER — Encounter (HOSPITAL_COMMUNITY): Payer: Self-pay | Admitting: *Deleted

## 2014-05-18 ENCOUNTER — Emergency Department (HOSPITAL_COMMUNITY): Payer: Managed Care, Other (non HMO)

## 2014-05-18 ENCOUNTER — Emergency Department (HOSPITAL_COMMUNITY)
Admission: EM | Admit: 2014-05-18 | Discharge: 2014-05-18 | Disposition: A | Discharge status: Home / Self Care | Payer: Managed Care, Other (non HMO) | Attending: Emergency Medicine | Admitting: Emergency Medicine

## 2014-05-18 DIAGNOSIS — Z793 Long term (current) use of hormonal contraceptives: Secondary | ICD-10-CM | POA: Insufficient documentation

## 2014-05-18 DIAGNOSIS — M25552 Pain in left hip: Secondary | ICD-10-CM | POA: Diagnosis present

## 2014-05-18 DIAGNOSIS — Z79899 Other long term (current) drug therapy: Secondary | ICD-10-CM | POA: Insufficient documentation

## 2014-05-18 DIAGNOSIS — Z87442 Personal history of urinary calculi: Secondary | ICD-10-CM | POA: Insufficient documentation

## 2014-05-18 DIAGNOSIS — Z792 Long term (current) use of antibiotics: Secondary | ICD-10-CM | POA: Diagnosis not present

## 2014-05-18 DIAGNOSIS — F419 Anxiety disorder, unspecified: Secondary | ICD-10-CM | POA: Insufficient documentation

## 2014-05-18 DIAGNOSIS — M5416 Radiculopathy, lumbar region: Secondary | ICD-10-CM | POA: Diagnosis not present

## 2014-05-18 DIAGNOSIS — Z87891 Personal history of nicotine dependence: Secondary | ICD-10-CM | POA: Insufficient documentation

## 2014-05-18 DIAGNOSIS — Z8742 Personal history of other diseases of the female genital tract: Secondary | ICD-10-CM | POA: Diagnosis not present

## 2014-05-18 DIAGNOSIS — R110 Nausea: Secondary | ICD-10-CM | POA: Diagnosis not present

## 2014-05-18 DIAGNOSIS — Z9889 Other specified postprocedural states: Secondary | ICD-10-CM | POA: Diagnosis not present

## 2014-05-18 MED ORDER — OXYCODONE-ACETAMINOPHEN 5-325 MG PO TABS: 1.0000 | ORAL_TABLET | Freq: Four times a day (QID) | ORAL | Status: DC | PRN

## 2014-05-18 MED ORDER — OXYCODONE-ACETAMINOPHEN 5-325 MG PO TABS
1.0000 | ORAL_TABLET | Freq: Once | ORAL | Status: AC
  Administered 2014-05-18: 1 via ORAL
  Filled 2014-05-18: qty 1

## 2014-05-18 MED ORDER — ONDANSETRON 4 MG PO TBDP
4.0000 mg | ORAL_TABLET | Freq: Once | ORAL | Status: AC
  Administered 2014-05-18: 4 mg via ORAL
  Filled 2014-05-18: qty 1

## 2014-05-18 MED ORDER — HYDROMORPHONE HCL 1 MG/ML IJ SOLN
1.0000 mg | Freq: Once | INTRAMUSCULAR | Status: AC
  Administered 2014-05-18: 1 mg via INTRAVENOUS
  Filled 2014-05-18: qty 1

## 2014-05-18 MED ORDER — CYCLOBENZAPRINE HCL 10 MG PO TABS
10.0000 mg | ORAL_TABLET | Freq: Once | ORAL | Status: AC
  Administered 2014-05-18: 10 mg via ORAL
  Filled 2014-05-18: qty 1

## 2014-05-18 MED ORDER — CYCLOBENZAPRINE HCL 10 MG PO TABS: 10.0000 mg | ORAL_TABLET | Freq: Two times a day (BID) | ORAL | Status: DC | PRN

## 2014-05-18 MED ORDER — PREDNISONE 50 MG PO TABS
60.0000 mg | ORAL_TABLET | Freq: Once | ORAL | Status: AC
  Administered 2014-05-18: 60 mg via ORAL
  Filled 2014-05-18 (×2): qty 1

## 2014-05-18 MED ORDER — PREDNISONE 20 MG PO TABS: 20.0000 mg | ORAL_TABLET | Freq: Two times a day (BID) | ORAL | Status: DC

## 2014-05-18 MED ORDER — KETOROLAC TROMETHAMINE 60 MG/2ML IM SOLN
60.0000 mg | Freq: Once | INTRAMUSCULAR | Status: AC
  Administered 2014-05-18: 60 mg via INTRAMUSCULAR
  Filled 2014-05-18: qty 2

## 2014-05-18 NOTE — ED Provider Notes (Signed)
CSN: [id number]     Arrival date & time 05/18/14  4098 History   First MD Initiated Contact with Lindsay Nichols 05/18/14 [phone]     Chief Complaint  Lindsay Nichols presents with  . Hip Pain     (Consider location/radiation/quality/duration/timing/severity/associated sxs/prior Treatment) Lindsay Nichols is a 34 y.o. female presenting with hip pain. The history is provided by the Lindsay Nichols.  Hip Pain This is a new problem. The current episode started yesterday. The problem occurs constantly. The problem has been gradually worsening. Associated symptoms include nausea. Pertinent negatives include no abdominal pain or vomiting. The symptoms are aggravated by coughing, twisting, walking and standing. She has tried nothing for the symptoms.    Lindsay Nichols is a 34 y.o. female who presents to the ED with left hip pain. The pain started yesterday about 3 pm. The Lindsay Nichols does not remember any injury to the area. The pain radiates to the lower left back. The pain is severe and is worse with ambulation or any movement.   Past Medical History  Diagnosis Date  . Kidney stone   . Anxiety   . PID (pelvic inflammatory disease)   . RLQ abdominal pain 11/01/2013   Past Surgical History  Procedure Laterality Date  . Tonsillectomy    . Back surgery    . Shoulder surgery     Family History  Problem Relation Age of Onset  . Stroke Mother   . Other Mother     brain aneursym  . Hypertension Mother   . Lupus Mother   . COPD Father   . Lupus Sister   . Cancer Maternal Grandmother     ovarian  . Other Maternal Grandfather     brain aneursym  . Other Sister     brain aneursym   History  Substance Use Topics  . Smoking status: Former Smoker    Types: Cigarettes  . Smokeless tobacco: Never Used  . Alcohol Use: No   OB History    Gravida Para Term Preterm AB TAB SAB Ectopic Multiple Living   Review of Systems  Gastrointestinal: Positive for nausea. Negative for vomiting and abdominal pain.    Musculoskeletal: Positive for back pain.       Left hip pain  all other systems negative  Allergies  Review of Lindsay Nichols's allergies indicates no known allergies.  Home Medications   Prior to Admission medications   Medication Sig Start Date End Date Taking? Authorizing Provider  diphenhydrAMINE (BENADRYL) 50 MG capsule Take 100 mg by mouth at bedtime as needed for sleep.   Yes Historical Provider, MD  escitalopram (LEXAPRO) 20 MG tablet Take 20 mg by mouth daily.   Yes Historical Provider, MD  norgestimate-ethinyl estradiol (ORTHO-CYCLEN,SPRINTEC,PREVIFEM) 0.25-35 MG-MCG tablet Take 1 tablet by mouth daily. 01/31/14  Yes Adline Potter, NP  azithromycin (ZITHROMAX) 500 MG tablet Take 1 tablet (500 mg total) by mouth daily. Lindsay Nichols not taking: Reported on 05/18/2014 10/27/13   Adline Potter, NP  metroNIDAZOLE (FLAGYL) 500 MG tablet Take 1 tablet (500 mg total) by mouth 2 (two) times daily. Lindsay Nichols not taking: Reported on 05/18/2014 10/27/13   Adline Potter, NP  promethazine (PHENERGAN) 25 MG tablet Take 1 tablet (25 mg total) by mouth every 6 (six) hours as needed for nausea or vomiting. Lindsay Nichols not taking: Reported on 05/18/2014 10/27/13   Adline Potter, NP   BP 109/55 mmHg  Pulse 72  Temp(Src)  98.3 F (36.8 C) (Oral)  Resp 18  Ht 5\' 7"  (1.702 m)  Wt 270 lb (122.471 kg)  BMI 42.28 kg/m2  SpO2 99%  LMP  (LMP Unknown) Physical Exam  Constitutional: She is oriented to person, place, and time. She appears well-developed and well-nourished. No distress.  Lindsay Nichols crying and appears to be in significant pain.   HENT:  Head: Normocephalic and atraumatic.  Right Ear: Tympanic membrane normal.  Left Ear: Tympanic membrane normal.  Nose: Nose normal.  Mouth/Throat: Uvula is midline, oropharynx is clear and moist and mucous membranes are normal.  Eyes: Conjunctivae and EOM are normal.  Neck: Normal range of motion. Neck supple.  Cardiovascular: Normal rate and regular  rhythm.   Pulmonary/Chest: Effort normal. She has no wheezes. She has no rales.  Abdominal: Soft. Bowel sounds are normal. There is no tenderness.  Musculoskeletal: Normal range of motion.       Lumbar back: She exhibits tenderness, pain and spasm. She exhibits normal pulse.       Back:  Difficulty with ambulation due to pain. Unable to do straight leg raise on the left. Pedal pulses equal, adequate circulation, good touch sensation.   Neurological: She is alert and oriented to person, place, and time. She has normal strength. No cranial nerve deficit or sensory deficit.  Reflex Scores:      Bicep reflexes are 2+ on the right side and 2+ on the left side.      Brachioradialis reflexes are 2+ on the right side and 2+ on the left side.      Patellar reflexes are 2+ on the right side and 2+ on the left side.      Achilles reflexes are 2+ on the right side and 2+ on the left side. No foot drag with ambulation but sever pain with weight bearing on the left leg.   Skin: Skin is warm and dry.  Psychiatric: She has a normal mood and affect. Her behavior is normal.  Nursing note and vitals reviewed.   ED Course  Procedures (including critical care time) Labs Review Pain management, MR of lumbar spine, observation  Imaging Review Mr Lumbar Spine Wo Contrast  05/18/2014   CLINICAL DATA:  Chronic low back pain extending into the left lower extremity. Symptoms have been worse over the last week. Lumbar spine surgery 2006.  EXAM: MRI LUMBAR SPINE WITHOUT CONTRAST  TECHNIQUE: Multiplanar, multisequence MR imaging of the lumbar spine was performed. No intravenous contrast was administered.  COMPARISON:  CT of the abdomen and pelvis 10/16/2010.  FINDINGS: Normal signal is present in the conus medullaris which terminates at L1-2. Minimal endplate marrow changes are present at L5-S1. Marrow signal, vertebral body heights, and alignment are otherwise normal.  Limited imaging the abdomen is unremarkable.  The  disc levels at L4-5 and above are normal.  No residual or recurrent rightward disc protrusion is present at L5-S1. This contacts the traversing right S1 nerve roots. There is minimal extension of disc material into the neural foramina bilaterally without significant stenosis.  IMPRESSION: 1. A residual or recurrent shallow rightward disc protrusion at L5-S1 potentially contacts the traversing right S1 nerve roots. 2. Right laminectomy at L5. 3. The disc extends into the neural foramina bilaterally without significant stenosis.   Electronically Signed   By: Marin Robertshristopher  Mattern M.D.   On: 05/18/2014 11:02   After pain medications and Lindsay Nichols return from CT Lindsay Nichols re evaluated. She has improved some since the treatment but continues to have  the pain in the left lower back and hip.  MDM  34 y.o. female with left lower back and hip pain. Discussed with the Lindsay Nichols and her family clinical and MRI findings and plan of care. All questioned fully answered. Stable for d/c with treatment for pain and follow up with the neurosurgeon in [place] that did her previous surgery.  Lindsay Nichols agrees with plan.   Final diagnoses:  Lumbar back pain with radiculopathy affecting left lower extremity      Janne Napoleon, NP 05/20/14 1610  Benjiman Core, MD 05/22/14 [phone]

## 2014-05-18 NOTE — ED Notes (Signed)
Severe left hip pain began yesterday. Pt is tearful

## 2014-05-18 NOTE — Discharge Instructions (Signed)
Call for appointment for Follow up with Dr. Jule SerNudleman.

## 2014-06-13 ENCOUNTER — Encounter: Payer: Self-pay | Admitting: Adult Health

## 2014-06-13 ENCOUNTER — Ambulatory Visit (INDEPENDENT_AMBULATORY_CARE_PROVIDER_SITE_OTHER): Payer: Commercial Indemnity | Admitting: Adult Health

## 2014-06-13 VITALS — BP 120/72 | HR 108 | Ht 67.0 in | Wt 286.5 lb

## 2014-06-13 DIAGNOSIS — Z3490 Encounter for supervision of normal pregnancy, unspecified, unspecified trimester: Secondary | ICD-10-CM

## 2014-06-13 DIAGNOSIS — R7989 Other specified abnormal findings of blood chemistry: Secondary | ICD-10-CM | POA: Insufficient documentation

## 2014-06-13 DIAGNOSIS — Z3201 Encounter for pregnancy test, result positive: Secondary | ICD-10-CM | POA: Diagnosis not present

## 2014-06-13 DIAGNOSIS — R1031 Right lower quadrant pain: Secondary | ICD-10-CM | POA: Diagnosis not present

## 2014-06-13 DIAGNOSIS — O3680X0 Pregnancy with inconclusive fetal viability, not applicable or unspecified: Secondary | ICD-10-CM

## 2014-06-13 HISTORY — DX: Encounter for supervision of normal pregnancy, unspecified, unspecified trimester: Z34.90

## 2014-06-13 LAB — POCT URINE PREGNANCY: Preg Test, Ur: POSITIVE

## 2014-06-13 NOTE — Patient Instructions (Signed)
First Trimester of Pregnancy The first trimester of pregnancy is from week 1 until the end of week 12 (months 1 through 3). A week after a sperm fertilizes an egg, the egg will implant on the wall of the uterus. This embryo will begin to develop into a baby. Genes from you and your partner are forming the baby. The female genes determine whether the baby is a boy or a girl. At 6-8 weeks, the eyes and face are formed, and the heartbeat can be seen on ultrasound. At the end of 12 weeks, all the baby's organs are formed.  Now that you are pregnant, you will want to do everything you can to have a healthy baby. Two of the most important things are to get good prenatal care and to follow your health care provider's instructions. Prenatal care is all the medical care you receive before the baby's birth. This care will help prevent, find, and treat any problems during the pregnancy and childbirth. BODY CHANGES Your body goes through many changes during pregnancy. The changes vary from woman to woman.   You may gain or lose a couple of pounds at first.  You may feel sick to your stomach (nauseous) and throw up (vomit). If the vomiting is uncontrollable, call your health care provider.  You may tire easily.  You may develop headaches that can be relieved by medicines approved by your health care provider.  You may urinate more often. Painful urination may mean you have a bladder infection.  You may develop heartburn as a result of your pregnancy.  You may develop constipation because certain hormones are causing the muscles that push waste through your intestines to slow down.  You may develop hemorrhoids or swollen, bulging veins (varicose veins).  Your breasts may begin to grow larger and become tender. Your nipples may stick out more, and the tissue that surrounds them (areola) may become darker.  Your gums may bleed and may be sensitive to brushing and flossing.  Dark spots or blotches (chloasma,  mask of pregnancy) may develop on your face. This will likely fade after the baby is born.  Your menstrual periods will stop.  You may have a loss of appetite.  You may develop cravings for certain kinds of food.  You may have changes in your emotions from day to day, such as being excited to be pregnant or being concerned that something may go wrong with the pregnancy and baby.  You may have more vivid and strange dreams.  You may have changes in your hair. These can include thickening of your hair, rapid growth, and changes in texture. Some women also have hair loss during or after pregnancy, or hair that feels dry or thin. Your hair will most likely return to normal after your baby is born. WHAT TO EXPECT AT YOUR PRENATAL VISITS During a routine prenatal visit:  You will be weighed to make sure you and the baby are growing normally.  Your blood pressure will be taken.  Your abdomen will be measured to track your baby's growth.  The fetal heartbeat will be listened to starting around week 10 or 12 of your pregnancy.  Test results from any previous visits will be discussed. Your health care provider may ask you:  How you are feeling.  If you are feeling the baby move.  If you have had any abnormal symptoms, such as leaking fluid, bleeding, severe headaches, or abdominal cramping.  If you have any questions. Other tests   that may be performed during your first trimester include:  Blood tests to find your blood type and to check for the presence of any previous infections. They will also be used to check for low iron levels (anemia) and Rh antibodies. Later in the pregnancy, blood tests for diabetes will be done along with other tests if problems develop.  Urine tests to check for infections, diabetes, or protein in the urine.  An ultrasound to confirm the proper growth and development of the baby.  An amniocentesis to check for possible genetic problems.  Fetal screens for  spina bifida and Down syndrome.  You may need other tests to make sure you and the baby are doing well. HOME CARE INSTRUCTIONS  Medicines  Follow your health care provider's instructions regarding medicine use. Specific medicines may be either safe or unsafe to take during pregnancy.  Take your prenatal vitamins as directed.  If you develop constipation, try taking a stool softener if your health care provider approves. Diet  Eat regular, well-balanced meals. Choose a variety of foods, such as meat or vegetable-based protein, fish, milk and low-fat dairy products, vegetables, fruits, and whole grain breads and cereals. Your health care provider will help you determine the amount of weight gain that is right for you.  Avoid raw meat and uncooked cheese. These carry germs that can cause birth defects in the baby.  Eating four or five small meals rather than three large meals a day may help relieve nausea and vomiting. If you start to feel nauseous, eating a few soda crackers can be helpful. Drinking liquids between meals instead of during meals also seems to help nausea and vomiting.  If you develop constipation, eat more high-fiber foods, such as fresh vegetables or fruit and whole grains. Drink enough fluids to keep your urine clear or pale yellow. Activity and Exercise  Exercise only as directed by your health care provider. Exercising will help you:  Control your weight.  Stay in shape.  Be prepared for labor and delivery.  Experiencing pain or cramping in the lower abdomen or low back is a good sign that you should stop exercising. Check with your health care provider before continuing normal exercises.  Try to avoid standing for long periods of time. Move your legs often if you must stand in one place for a long time.  Avoid heavy lifting.  Wear low-heeled shoes, and practice good posture.  You may continue to have sex unless your health care provider directs you  otherwise. Relief of Pain or Discomfort  Wear a good support bra for breast tenderness.   Take warm sitz baths to soothe any pain or discomfort caused by hemorrhoids. Use hemorrhoid cream if your health care provider approves.   Rest with your legs elevated if you have leg cramps or low back pain.  If you develop varicose veins in your legs, wear support hose. Elevate your feet for 15 minutes, 3-4 times a day. Limit salt in your diet. Prenatal Care  Schedule your prenatal visits by the twelfth week of pregnancy. They are usually scheduled monthly at first, then more often in the last 2 months before delivery.  Write down your questions. Take them to your prenatal visits.  Keep all your prenatal visits as directed by your health care provider. Safety  Wear your seat belt at all times when driving.  Make a list of emergency phone numbers, including numbers for family, friends, the hospital, and police and fire departments. General Tips    Ask your health care provider for a referral to a local prenatal education class. Begin classes no later than at the beginning of month 6 of your pregnancy.  Ask for help if you have counseling or nutritional needs during pregnancy. Your health care provider can offer advice or refer you to specialists for help with various needs.  Do not use hot tubs, steam rooms, or saunas.  Do not douche or use tampons or scented sanitary pads.  Do not cross your legs for long periods of time.  Avoid cat litter boxes and soil used by cats. These carry germs that can cause birth defects in the baby and possibly loss of the fetus by miscarriage or stillbirth.  Avoid all smoking, herbs, alcohol, and medicines not prescribed by your health care provider. Chemicals in these affect the formation and growth of the baby.  Schedule a dentist appointment. At home, brush your teeth with a soft toothbrush and be gentle when you floss. SEEK MEDICAL CARE IF:   You have  dizziness.  You have mild pelvic cramps, pelvic pressure, or nagging pain in the abdominal area.  You have persistent nausea, vomiting, or diarrhea.  You have a bad smelling vaginal discharge.  You have pain with urination.  You notice increased swelling in your face, hands, legs, or ankles. SEEK IMMEDIATE MEDICAL CARE IF:   You have a fever.  You are leaking fluid from your vagina.  You have spotting or bleeding from your vagina.  You have severe abdominal cramping or pain.  You have rapid weight gain or loss.  You vomit blood or material that looks like coffee grounds.  You are exposed to [place] measles and have never had them.  You are exposed to fifth disease or chickenpox.  You develop a severe headache.  You have shortness of breath.  You have any kind of trauma, such as from a fall or a car accident. Document Released: 02/11/2001 Document Revised: 07/04/2013 Document Reviewed: 12/28/2012 [facility] Patient Information 2015 [place] Place[place]Care, [place]. This information is not intended to replace advice given to you by your health care provider. Make sure you discuss any questions you have with your health care provider. Return in 1 week for US wil talk in am Ok to take tylenol

## 2014-06-13 NOTE — Progress Notes (Signed)
Subjective:     Patient ID: Lindsay Nichols, female   DOB: 02/10/1981, 33 y.o.   MRN: [id number]  HPI Lindsay Nichols is a 33 year old white female in for having missed a period and needs UPT.She denies any bleeding but has had RLQ pain that goes and comes for 4 days, it was a 8/10 this am, no nausea or vomiting or fever noted.Has history of PID.It is noteworthy she had MRI and xrays on back in last month or so and took percocet,dilaudid,flexeril and prednisone.   Review of Systems + missed period + RLQ pain, all other systems negative Reviewed past medical,surgical, social and family history. Reviewed medications and allergies.     Objective:   Physical Exam BP 120/72 mmHg  Pulse 108  Ht 5' 7" (1.702 m)  Wt 286 lb 8 oz (129.956 kg)  BMI 44.86 kg/m2  LMP 02/10/2016UPT + about 8 + 5 weeks by LMP with EDD 01/18/15, Skin warm and dry. Neck: mid line trachea, normal thyroid, good ROM, no lymphadenopathy noted. Lungs: clear to ausculation bilaterally. Cardiovascular: regular rate and rhythm.+RLQ pain, got US has IUP with +YS no fetal pole or CDS fluid no adnexal masses noted or cysts on ovaries, Dr Eure in for co exam.Will get QHCG and CBC and ESR and talk in am, she is aware she is not as far along and that it does not look like ectopic, has history of PID in past.     Assessment:     Pregnant +UPT RLQ pain    Plan:     Check CBC,ESR and QHCG Return in 1 week for dating US Review handout on first trimester and pelvic pain If pain increases go to ER OK to take tylenol for pain No sex      

## 2014-06-14 ENCOUNTER — Telehealth: Payer: Self-pay | Admitting: Adult Health

## 2014-06-14 LAB — CBC
HCT: 43.4 % (ref 34.0–46.6)
Hemoglobin: 14.6 g/dL (ref 11.1–15.9)
MCH: 28.7 pg (ref 26.6–33.0)
MCHC: 33.6 g/dL (ref 31.5–35.7)
MCV: 85 fL (ref 79–97)
Platelets: 174 10*3/uL (ref 150–379)
RBC: 5.08 x10E6/uL (ref 3.77–5.28)
RDW: 14.3 % (ref 12.3–15.4)
WBC: 7 10*3/uL (ref 3.4–10.8)

## 2014-06-14 LAB — SEDIMENTATION RATE: Sed Rate: 12 mm/hr (ref 0–32)

## 2014-06-14 LAB — BETA HCG QUANT (REF LAB): hCG Quant: 18191 m[IU]/mL

## 2014-06-14 NOTE — Telephone Encounter (Signed)
Pt aware of labs get US next week

## 2014-06-21 ENCOUNTER — Ambulatory Visit (INDEPENDENT_AMBULATORY_CARE_PROVIDER_SITE_OTHER): Payer: Commercial Indemnity | Admitting: Adult Health

## 2014-06-21 ENCOUNTER — Encounter: Payer: Self-pay | Admitting: Adult Health

## 2014-06-21 ENCOUNTER — Ambulatory Visit (INDEPENDENT_AMBULATORY_CARE_PROVIDER_SITE_OTHER): Payer: Commercial Indemnity

## 2014-06-21 ENCOUNTER — Other Ambulatory Visit: Payer: Self-pay | Admitting: Adult Health

## 2014-06-21 VITALS — BP 120/80 | HR 76 | Ht 67.0 in | Wt 284.5 lb

## 2014-06-21 DIAGNOSIS — R1031 Right lower quadrant pain: Secondary | ICD-10-CM

## 2014-06-21 DIAGNOSIS — O3680X0 Pregnancy with inconclusive fetal viability, not applicable or unspecified: Secondary | ICD-10-CM | POA: Diagnosis not present

## 2014-06-21 DIAGNOSIS — O418X10 Other specified disorders of amniotic fluid and membranes, first trimester, not applicable or unspecified: Secondary | ICD-10-CM

## 2014-06-21 DIAGNOSIS — O468X1 Other antepartum hemorrhage, first trimester: Secondary | ICD-10-CM

## 2014-06-21 DIAGNOSIS — O208 Other hemorrhage in early pregnancy: Secondary | ICD-10-CM | POA: Insufficient documentation

## 2014-06-21 DIAGNOSIS — Z3490 Encounter for supervision of normal pregnancy, unspecified, unspecified trimester: Secondary | ICD-10-CM

## 2014-06-21 HISTORY — DX: Other hemorrhage in early pregnancy: O20.8

## 2014-06-21 HISTORY — DX: Other specified disorders of amniotic fluid and membranes, first trimester, not applicable or unspecified: O41.8X10

## 2014-06-21 NOTE — Progress Notes (Addendum)
Subjective:     Patient ID: Lindsay Nichols, female   DOB: 07-31-1980, 34 y.o.   MRN: [id number]  HPI Lindsay Nichols is a 34 year old white female in for follow up of having pain last week and +UPT, had US today,that showed no FCA and size less than dates.  Review of Systems  Patient denies any headaches, hearing loss, fatigue, blurred vision, shortness of breath, chest pain, abdominal pain, problems with bowel movements, urination, or intercourse. No joint pain or mood swings.No bleeding, has had some pain at times. Reviewed past medical,surgical, social and family history. Reviewed medications and allergies.     Objective:   Physical Exam BP 120/80 mmHg  Pulse 76  Ht 5\' 7"  (1.702 m)  Wt 284 lb 8 oz (129.048 kg)  BMI 44.55 kg/m2  LMP 04/12/2014 Talk only, face time 10 minutes to discuss US, showed .4 cm fetal pole with no FCA and large yolk sac, and 1.3 x .7 x.6 cm [facility], she is aware that this is early and will need to recheck in 1 week for growth and FCA, if none will be miscarriage, and options if that is the case, like following or cytotec.She is aware we just don't know yet.Blood type is A+ per pt.    Assessment:     Pregnant Subchorionic hemorrhage Inconclusive viability of fetus    Plan:     No sex Take OTC prenatal vitamin Return in 1 week for US and see me

## 2014-06-21 NOTE — Progress Notes (Signed)
US single IUP,crl .4cm=661w6d no fht,large YS .73cm, 1.3 x .6 x .7cm subchorionic hemorrhage,normal ov's,no free fluid seen,pt will seen Victorino DikeJennifer today and f/u ultrasound 1 wk.

## 2014-06-21 NOTE — Patient Instructions (Signed)
No sex Return in 1 week for US Take OTC vitamins

## 2014-06-25 LAB — US OB COMP LESS 14 WKS

## 2014-06-25 LAB — US OB TRANSVAGINAL

## 2014-06-27 ENCOUNTER — Encounter: Payer: Self-pay | Admitting: Adult Health

## 2014-06-27 ENCOUNTER — Ambulatory Visit (INDEPENDENT_AMBULATORY_CARE_PROVIDER_SITE_OTHER): Payer: Commercial Indemnity

## 2014-06-27 ENCOUNTER — Ambulatory Visit (INDEPENDENT_AMBULATORY_CARE_PROVIDER_SITE_OTHER): Payer: Commercial Indemnity | Admitting: Adult Health

## 2014-06-27 VITALS — BP 110/70 | HR 88 | Ht 67.0 in | Wt 286.0 lb

## 2014-06-27 DIAGNOSIS — J069 Acute upper respiratory infection, unspecified: Secondary | ICD-10-CM | POA: Insufficient documentation

## 2014-06-27 DIAGNOSIS — O418X10 Other specified disorders of amniotic fluid and membranes, first trimester, not applicable or unspecified: Secondary | ICD-10-CM

## 2014-06-27 DIAGNOSIS — O3680X0 Pregnancy with inconclusive fetal viability, not applicable or unspecified: Secondary | ICD-10-CM | POA: Diagnosis not present

## 2014-06-27 DIAGNOSIS — Z1389 Encounter for screening for other disorder: Secondary | ICD-10-CM

## 2014-06-27 DIAGNOSIS — Z331 Pregnant state, incidental: Secondary | ICD-10-CM

## 2014-06-27 DIAGNOSIS — O3680X1 Pregnancy with inconclusive fetal viability, fetus 1: Secondary | ICD-10-CM

## 2014-06-27 DIAGNOSIS — O468X1 Other antepartum hemorrhage, first trimester: Secondary | ICD-10-CM

## 2014-06-27 HISTORY — DX: Acute upper respiratory infection, unspecified: J06.9

## 2014-06-27 LAB — POCT URINALYSIS DIPSTICK
Blood, UA: NEGATIVE
Glucose, UA: NEGATIVE
Ketones, UA: NEGATIVE
Leukocytes, UA: NEGATIVE
Nitrite, UA: NEGATIVE
Protein, UA: NEGATIVE

## 2014-06-27 NOTE — Progress Notes (Signed)
Subjective:     Patient ID: Lindsay Nichols, female   DOB: Cai 24, 1982, 34 y.o.   MRN: [id number]  HPI Gene is a 34 year old white female back in follow up of no FHM on last US and [facility].She has had no bleeding but has had URI symptoms like cough and fever.   Review of Systems  See HPI for positives, all other systems negative Reviewed past medical,surgical, social and family history. Reviewed medications and allergies.     Objective:   Physical Exam BP 110/70 mmHg  Pulse 88  Ht 5\' 7"  (1.702 m)  Wt 286 lb (129.729 kg)  BMI 44.78 kg/m2  LMP 04/12/2014 Skin warm and dry. Neck: mid line trachea, normal thyroid, good ROM, no lymphadenopathy noted. Lungs: clear to ausculation bilaterally. Cardiovascular: regular rate and rhythm.US showed +FHM 58 and SCH stable,EDD 02/21/15, no growth of fetal pole, pt aware will recheck in 1 week.   Ears with wax and throat not red,no swelling or pustules noted.  Assessment:     Pregnant,size <dates  [facility] URI     Plan:    Still no sex Return in 1 week for follow up US Check QHCG and progesterone level Use comfort measures for URI Note given for missed work Sunday

## 2014-06-27 NOTE — Progress Notes (Signed)
US IUP pos fht 58 bpm bradycardia, crl 3.529mm = 5+6wks, fht not seen on previous ultrasound, subchorionic hemorrhage 1.6 x .8 x .3cm n/c from previous us,normal ov's. Pt will come back next wk for f/u ultrasound, per Victorino DikeJennifer.

## 2014-06-27 NOTE — Patient Instructions (Signed)
Return in 1 week for for US Take tylenol warm salt gargles and cough drops

## 2014-06-28 ENCOUNTER — Telehealth: Payer: Self-pay | Admitting: Adult Health

## 2014-06-28 LAB — BETA HCG QUANT (REF LAB): hCG Quant: 16332 m[IU]/mL

## 2014-06-28 LAB — PROGESTERONE: Progesterone: 5.8 ng/mL

## 2014-06-28 MED ORDER — PROGESTERONE MICRONIZED 200 MG PO CAPS: ORAL_CAPSULE | ORAL | Status: DC

## 2014-06-28 MED ORDER — AZITHROMYCIN 250 MG PO TABS: ORAL_TABLET | ORAL | Status: DC

## 2014-06-28 NOTE — Telephone Encounter (Signed)
Pt aware progesterone 5.8 which is low will rx prometrium 200 mg in vagina at HS discussed with Dr Emelda FearFerguson, put is feeling worse with URI has more congestion and cough with some hoarseness, will rx Z pack and may add inhaler if not better in am, feels pressure in headache and chest.

## 2014-06-28 NOTE — Telephone Encounter (Signed)
Spoke with pt. Pt has lost voice, headache, cough, no fever today, hard to breathe, feels like she has a weight on chest. Jenn spoke with pt. JSY

## 2014-06-30 ENCOUNTER — Ambulatory Visit: Payer: Commercial Indemnity | Admitting: Adult Health

## 2014-07-07 ENCOUNTER — Encounter: Payer: Self-pay | Admitting: Adult Health

## 2014-07-07 ENCOUNTER — Ambulatory Visit (INDEPENDENT_AMBULATORY_CARE_PROVIDER_SITE_OTHER): Payer: Commercial Indemnity

## 2014-07-07 ENCOUNTER — Ambulatory Visit (INDEPENDENT_AMBULATORY_CARE_PROVIDER_SITE_OTHER): Payer: Commercial Indemnity | Admitting: Adult Health

## 2014-07-07 VITALS — BP 116/90 | HR 120 | Ht 67.0 in | Wt 286.0 lb

## 2014-07-07 DIAGNOSIS — O021 Missed abortion: Secondary | ICD-10-CM | POA: Insufficient documentation

## 2014-07-07 DIAGNOSIS — O3680X1 Pregnancy with inconclusive fetal viability, fetus 1: Secondary | ICD-10-CM | POA: Diagnosis not present

## 2014-07-07 DIAGNOSIS — O039 Complete or unspecified spontaneous abortion without complication: Secondary | ICD-10-CM

## 2014-07-07 HISTORY — DX: Complete or unspecified spontaneous abortion without complication: O03.9

## 2014-07-07 NOTE — Progress Notes (Addendum)
US 5719w4d single IUP  w/NO fht,normal ov's bilat, 1.7 x 2.1 x .6cm subchorionic hemorrhage,large YS .7cm, pt is seeing Victorino DikeJennifer today.

## 2014-07-07 NOTE — Progress Notes (Signed)
Subjective:     Patient ID: Lindsay Nichols, female   DOB: [birth year], 34 y.o.   MRN: [id number]  HPI Lindsay Nichols is a 34 year old white female,married and has no FHM or growth on US.No pain or bleeding.  Review of Systems  Miscarriage on US, all other systems negative  Reviewed past medical,surgical, social and family history. Reviewed medications and allergies.     Objective:   Physical Exam BP 116/90 mmHg  Pulse 120  Ht 5\' 7"  (1.702 m)  Wt 286 lb (129.729 kg)  BMI 44.78 kg/m2  LMP 02/10/2016Pt had US this am and it showed no growth and no FHM, she is teary and upset, dicussed that she may start bleeding in next week or so and it would be like heavy period,can we can wait and watch or she could take cytotec and she wants to wait and watch for now, she said she might work tomorrow but not sure, will see back in 3 weeks but needs to be seen before that or has questions to call and she said she would.    Assessment:     Miscarriage     Plan:     Note to return to work 5/11 Return 5/27 at 8:30 to see me and get labs Call prn

## 2014-07-07 NOTE — Patient Instructions (Signed)
Return 5/27 at 8:30 am Call prn Note given to RTW 5/11

## 2014-07-10 ENCOUNTER — Telehealth: Payer: Self-pay | Admitting: Adult Health

## 2014-07-10 NOTE — Telephone Encounter (Signed)
Has pain, like PID, not miscarriage, offered appt today wants to come tomorrow at 3:45 pm

## 2014-07-11 ENCOUNTER — Encounter: Payer: Self-pay | Admitting: Adult Health

## 2014-07-11 ENCOUNTER — Ambulatory Visit (INDEPENDENT_AMBULATORY_CARE_PROVIDER_SITE_OTHER): Payer: Commercial Indemnity | Admitting: Adult Health

## 2014-07-11 VITALS — BP 100/70 | HR 88 | Ht 67.0 in | Wt 283.0 lb

## 2014-07-11 DIAGNOSIS — O039 Complete or unspecified spontaneous abortion without complication: Secondary | ICD-10-CM

## 2014-07-11 DIAGNOSIS — R102 Pelvic and perineal pain unspecified side: Secondary | ICD-10-CM

## 2014-07-11 DIAGNOSIS — N739 Female pelvic inflammatory disease, unspecified: Secondary | ICD-10-CM

## 2014-07-11 HISTORY — DX: Pelvic and perineal pain: R10.2

## 2014-07-11 MED ORDER — METRONIDAZOLE 500 MG PO TABS: 500.0000 mg | ORAL_TABLET | Freq: Two times a day (BID) | ORAL | Status: DC

## 2014-07-11 MED ORDER — HYDROCODONE-ACETAMINOPHEN 5-325 MG PO TABS: 1.0000 | ORAL_TABLET | Freq: Four times a day (QID) | ORAL | Status: DC | PRN

## 2014-07-11 MED ORDER — AZITHROMYCIN 500 MG PO TABS: ORAL_TABLET | ORAL | Status: DC

## 2014-07-11 MED ORDER — CEFTRIAXONE SODIUM 1 G IJ SOLR
250.0000 mg | Freq: Once | INTRAMUSCULAR | Status: AC
  Administered 2014-07-11: 250 mg via INTRAMUSCULAR

## 2014-07-11 NOTE — Progress Notes (Signed)
Subjective:     Patient ID: Lindsay Nichols, female   DOB: [birth year], 34 y.o.   MRN: [id number]  HPI Lindsay Nichols is a 34 year old white female in complaining of pelvic pain and burning like when had PID last year, and she is starting to spot from miscarriage, that was found last week.  Review of Systems Pelvic pain and burning, all other systems negative Reviewed past medical,surgical, social and family history. Reviewed medications and allergies.     Objective:   Physical Exam BP 100/70 mmHg  Pulse 88  Ht $R'5\' 7"'ww$  (1.702 m)  Wt 283 lb (128.368 kg)  BMI 44.31 kg/m2  LMP 04/12/2014  Breastfeeding? Unknown Skin warm and dry.Pelvic: external genitalia is normal in appearance no lesions, vagina:brown discharge without odor,urethra has no lesions or masses noted, cervix:smooth and bulbous,mild CMT, blood at os, uterus: normal size, shape and contour, and tender, no masses felt, adnexa: no masses, some  tenderness noted. Bladder is non tender and no masses felt. GC/CHL obtained. Discussed with Dr Elonda Husky.    She received rocephin 250 mg IM by Jeni Salles LPN and waited 15 mintues.  Assessment:     Pelvic pain PID Miscarriage     Plan:    Rx flagyl 500 mg #28 1 bid x 14 days Rx azithromycin 500 mg #2 take 2 po now Rx norco 5-325 mg #30 1 every 6 hours prn pain no refills Return in 3 days for follow up Check CBC,ESR and GC/CHL Increase fluids and rest Note to return to work 5/14 If pain increases go to ER   Review handout on PID

## 2014-07-11 NOTE — Patient Instructions (Signed)
Pelvic Inflammatory Disease °Pelvic inflammatory disease (PID) refers to an infection in some or all of the female organs. The infection can be in the uterus, ovaries, fallopian tubes, or the surrounding tissues in the pelvis. PID can cause abdominal or pelvic pain that comes on suddenly (acute pelvic pain). PID is a serious infection because it can lead to lasting (chronic) pelvic pain or the inability to have children (infertile).  °CAUSES  °The infection is often caused by the normal bacteria found in the vaginal tissues. PID may also be caused by an infection that is spread during sexual contact. PID can also occur following:  °· The birth of a baby.   °· A miscarriage.   °· An abortion.   °· Major pelvic surgery.   °· The use of an intrauterine device (IUD).   °· A sexual assault.   °RISK FACTORS °Certain factors can put a person at higher risk for PID, such as: °· Being younger than 25 years. °· Being sexually active at a young age. °· Using nonbarrier contraception. °· Having multiple sexual partners. °· Having sex with someone who has symptoms of a genital infection. °· Using oral contraception. °Other times, certain behaviors can increase the possibility of getting PID, such as: °· Having sex during your period. °· Using a vaginal douche. °· Having an intrauterine device (IUD) in place. °SYMPTOMS  °· Abdominal or pelvic pain.   °· Fever.   °· Chills.   °· Abnormal vaginal discharge. °· Abnormal uterine bleeding.   °· Unusual pain shortly after finishing your period. °DIAGNOSIS  °Your caregiver will choose some of the following methods to make a diagnosis, such as:  °· Performing a physical exam and history. A pelvic exam typically reveals a very tender uterus and surrounding pelvis.   °· Ordering laboratory tests including a pregnancy test, blood tests, and urine test.  °· Ordering cultures of the vagina and cervix to check for a sexually transmitted infection (STI). °· Performing an ultrasound.    °· Performing a laparoscopic procedure to look inside the pelvis.   °TREATMENT  °· Antibiotic medicines may be prescribed and taken by mouth.   °· Sexual partners may be treated when the infection is caused by a sexually transmitted disease (STD).   °· Hospitalization may be needed to give antibiotics intravenously. °· Surgery may be needed, but this is rare. °It may take weeks until you are completely well. If you are diagnosed with PID, you should also be checked for human immunodeficiency virus (HIV).   °HOME CARE INSTRUCTIONS  °· If given, take your antibiotics as directed. Finish the medicine even if you start to feel better.   °· Only take over-the-counter or prescription medicines for pain, discomfort, or fever as directed by your caregiver.   °· Do not have sexual intercourse until treatment is completed or as directed by your caregiver. If PID is confirmed, your recent sexual partner(s) will need treatment.   °· Keep your follow-up appointments. °SEEK MEDICAL CARE IF:  °· You have increased or abnormal vaginal discharge.   °· You need prescription medicine for your pain.   °· You vomit.   °· You cannot take your medicines.   °· Your partner has an STD.   °SEEK IMMEDIATE MEDICAL CARE IF:  °· You have a fever.   °· You have increased abdominal or pelvic pain.   °· You have chills.   °· You have pain when you urinate.   °· You are not better after 72 hours following treatment.   °MAKE SURE YOU:  °· Understand these instructions. °· Will watch your condition. °· Will get help right away if you are not doing well or get worse. °  Document Released: 02/17/2005 Document Revised: 06/14/2012 Document Reviewed: 02/13/2011 [facility] Patient Information 2015 [place], [place]. This information is not intended to replace advice given to you by your health care provider. Make sure you discuss any questions you have with your health care provider. Take meds  Follow up in 3 days If pain increases go to ER  increase  fluids and rest

## 2014-07-12 ENCOUNTER — Telehealth: Payer: Self-pay | Admitting: Adult Health

## 2014-07-12 LAB — CBC
Hematocrit: 40.9 % (ref 34.0–46.6)
Hemoglobin: 14 g/dL (ref 11.1–15.9)
MCH: 28.3 pg (ref 26.6–33.0)
MCHC: 34.2 g/dL (ref 31.5–35.7)
MCV: 83 fL (ref 79–97)
Platelets: 238 10*3/uL (ref 150–379)
RBC: 4.94 x10E6/uL (ref 3.77–5.28)
RDW: 14.3 % (ref 12.3–15.4)
WBC: 7.3 10*3/uL (ref 3.4–10.8)

## 2014-07-12 LAB — SEDIMENTATION RATE: Sed Rate: 34 mm/hr — ABNORMAL HIGH (ref 0–32)

## 2014-07-12 MED ORDER — DOXYCYCLINE HYCLATE 100 MG PO TBEC
100.0000 mg | DELAYED_RELEASE_TABLET | Freq: Two times a day (BID) | ORAL | Status: DC
Start: 2014-07-12 — End: 2014-08-04

## 2014-07-12 NOTE — Telephone Encounter (Signed)
Left message White count normal, sed rate 34 ,gc/chl not back

## 2014-07-12 NOTE — Telephone Encounter (Signed)
Pt aware of labs and still in pain will rx doxycycline 100 mg 1 bid x 10 days, go to ER if pain increases or come to office in am to be seen

## 2014-07-13 LAB — GC/CHLAMYDIA PROBE AMP
Chlamydia trachomatis, NAA: NEGATIVE
Neisseria gonorrhoeae by PCR: NEGATIVE

## 2014-07-14 ENCOUNTER — Ambulatory Visit (INDEPENDENT_AMBULATORY_CARE_PROVIDER_SITE_OTHER): Payer: Commercial Indemnity | Admitting: Adult Health

## 2014-07-14 ENCOUNTER — Encounter: Payer: Self-pay | Admitting: Adult Health

## 2014-07-14 VITALS — BP 100/78 | HR 72 | Temp 98.2°F | Ht 67.0 in | Wt 288.0 lb

## 2014-07-14 DIAGNOSIS — R102 Pelvic and perineal pain unspecified side: Secondary | ICD-10-CM

## 2014-07-14 DIAGNOSIS — O039 Complete or unspecified spontaneous abortion without complication: Secondary | ICD-10-CM

## 2014-07-14 DIAGNOSIS — N739 Female pelvic inflammatory disease, unspecified: Secondary | ICD-10-CM

## 2014-07-14 MED ORDER — MISOPROSTOL 200 MCG PO TABS: ORAL_TABLET | ORAL | Status: DC

## 2014-07-14 NOTE — Progress Notes (Signed)
Subjective:     Patient ID: Lindsay Nichols, female   DOB: 1980/06/10, 34 y.o.   MRN: [id number]  HPI Lindsay Nichols is a 34 year old white female back in follow up of pelvic pain, PID and miscarriage.She is bleeding heavy now and still has lots of pain/burning, she has had fever at home and is taking 2 norco.She was last seen 5/10 and received rocephin 250 mg Im in office and was given flagyl and azithromycin and doxycycline, she is still taking flagyl and doxycycline. Her WBC was normal and ESR 34 and GC/CHL was negative.  Review of Systems + pain/burning and bleeding, all other systems negative   Reviewed past medical,surgical, social and family history. Reviewed medications and allergies.  Objective:   Physical Exam BP 100/78 mmHg  Pulse 72  Temp(Src) 98.2 F (36.8 C)  Ht $R'5\' 7"'Zb$  (1.702 m)  Wt 288 lb (130.636 kg)  BMI 45.10 kg/m2 Skin warm and dry.Pelvic: external genitalia is normal in appearance no lesions, vagina: period like blood and clots without odor,urethra has no lesions or masses noted, cervix:smooth and bulbous,+CMT, uterus: normal size, shape and contour,  tender, no masses felt, adnexa: no masses, has  tenderness noted. Bladder is non tender and no masses felt. Dr Elonda Husky in for co exam, and he wants her to take cytotec to hasten process.    Assessment:     Pelvic pain Miscarriage  PID    Plan:    Return 5/17 for follow up Rx cytotec 200 mcg # 8 take 4 po today and 4 in am Keep taking antibiotics and pain meds, OK to take every 4 hours and take the phenergan too Rest  And increase fluids Not given to return to work 5/18   If pain increases go to ER

## 2014-07-14 NOTE — Patient Instructions (Signed)
Increase fluids Rest  Take cytotec 4 today and 4 in am  Return in 4 days If increased pain go to ER

## 2014-07-18 ENCOUNTER — Encounter: Payer: Self-pay | Admitting: Adult Health

## 2014-07-18 ENCOUNTER — Ambulatory Visit (INDEPENDENT_AMBULATORY_CARE_PROVIDER_SITE_OTHER): Payer: Commercial Indemnity | Admitting: Adult Health

## 2014-07-18 VITALS — BP 102/62 | HR 60 | Ht 67.0 in | Wt 288.5 lb

## 2014-07-18 DIAGNOSIS — N739 Female pelvic inflammatory disease, unspecified: Secondary | ICD-10-CM

## 2014-07-18 DIAGNOSIS — O039 Complete or unspecified spontaneous abortion without complication: Secondary | ICD-10-CM

## 2014-07-18 DIAGNOSIS — Z3201 Encounter for pregnancy test, result positive: Secondary | ICD-10-CM

## 2014-07-18 DIAGNOSIS — R102 Pelvic and perineal pain: Secondary | ICD-10-CM

## 2014-07-18 LAB — POCT URINE PREGNANCY: Preg Test, Ur: POSITIVE

## 2014-07-18 NOTE — Patient Instructions (Signed)
No sex Return in 2 weeks Return to work 5/19

## 2014-07-18 NOTE — Progress Notes (Signed)
Subjective:     Patient ID: Lindsay Nichols, female   DOB: [birth year], 34 y.o.   MRN: [id number]  HPI Lindsay Nichols is a 34 year old white female back in follow up of miscarriage, PID and pelvic pain.She says she took there cytotec and had increased bleeding and clots and she feels better but still has cramps and gets tired easily.  Review of Systems +bleeding and cramps,but is better, all other systems negative  Reviewed past medical,surgical, social and family history. Reviewed medications and allergies.     Objective:   Physical Exam BP 102/62 mmHg  Pulse 60  Ht 5\' 7"  (1.702 m)  Wt 288 lb 8 oz (130.863 kg)  BMI 45.17 kg/m2  Breastfeeding? No UPT +, Skin warm and dry.Pelvic: external genitalia is normal in appearance no lesions, vagina:black period like blood and small clots without odor,urethra has no lesions or masses noted, cervix:smooth and bulbous, mild CMT, much less than before, uterus: normal size, shape and contour, non tender, no masses felt, adnexa: no masses or tenderness noted. Bladder is non tender and no masses felt.    Assessment:     Miscarriage Pelvic pain  PID    Plan:     Check QHCG Return to work 5/19 note given Follow up in 2 weeks  Finish meds

## 2014-07-19 ENCOUNTER — Telehealth: Payer: Self-pay | Admitting: Adult Health

## 2014-07-19 LAB — BETA HCG QUANT (REF LAB): hCG Quant: 388 m[IU]/mL

## 2014-07-19 NOTE — Telephone Encounter (Signed)
Pt aware QHCG dropped to 388 will recheck in 2 weeks

## 2014-08-01 ENCOUNTER — Ambulatory Visit: Payer: Commercial Indemnity | Admitting: Adult Health

## 2014-08-01 ENCOUNTER — Encounter: Payer: Self-pay | Admitting: Adult Health

## 2014-08-04 ENCOUNTER — Encounter: Payer: Self-pay | Admitting: Adult Health

## 2014-08-04 ENCOUNTER — Ambulatory Visit (INDEPENDENT_AMBULATORY_CARE_PROVIDER_SITE_OTHER): Payer: Commercial Indemnity | Admitting: Adult Health

## 2014-08-04 VITALS — BP 100/78 | HR 88 | Ht 67.0 in | Wt 282.5 lb

## 2014-08-04 DIAGNOSIS — O039 Complete or unspecified spontaneous abortion without complication: Secondary | ICD-10-CM

## 2014-08-04 DIAGNOSIS — Z30011 Encounter for initial prescription of contraceptive pills: Secondary | ICD-10-CM

## 2014-08-04 DIAGNOSIS — Z3202 Encounter for pregnancy test, result negative: Secondary | ICD-10-CM | POA: Diagnosis not present

## 2014-08-04 LAB — POCT URINE PREGNANCY: Preg Test, Ur: NEGATIVE

## 2014-08-04 MED ORDER — NORETHIN-ETH ESTRAD-FE BIPHAS 1 MG-10 MCG / 10 MCG PO TABS: 1.0000 | ORAL_TABLET | Freq: Every day | ORAL | Status: DC

## 2014-08-04 NOTE — Patient Instructions (Signed)
Will check QHCG today and talk Monday of negative will start  Lo loestrin Monday  Follow up prn

## 2014-08-04 NOTE — Progress Notes (Signed)
Subjective:     Patient ID: Lindsay Nichols, female   DOB: 02/06/1981, 34 y.o.   MRN: [id number]  HPI Lindsay Nichols is a 34 year old white female, married back in follow up of miscarriage, she is still bleeding, has been heavier the last 2 days, and has been passing clots 2-3 x daily, has no pain(her bleeding started 5/9 and she took cytotec 5/13 and 5/14).She has started new job and will be without insurance for 3 months and she wants to get on birth control.  Review of Systems  Patient denies any headaches, hearing loss, fatigue, blurred vision, shortness of breath, chest pain, abdominal pain, problems with bowel movements, urination, or intercourse(not having sex). No joint pain or mood swings.+bleeding with clots  Reviewed past medical,surgical, social and family history. Reviewed medications and allergies.     Objective:   Physical Exam BP 100/78 mmHg  Pulse 88  Ht 5\' 7"  (1.702 m)  Wt 282 lb 8 oz (128.141 kg)  BMI 44.24 kg/m2UPT negative, but as it sat was faint +, Skin warm and dry.Pelvic: external genitalia is normal in appearance no lesions, vagina:period like blood without odor,urethra has no lesions or masses noted, cervix:smooth and bulbous, negative CMT, uterus: normal size, shape and contour, non tender, no masses felt, adnexa: no masses or tenderness noted. Bladder is non tender and no masses felt.[facility], her last one was 5/17 and it was 388.   Discussed starting OCs for now and she agrees but may want depo, as she will wait awhile before trying to get pregnant again.  Assessment:    Miscarriage Contraceptive management    Plan:     [facility], will talk Monday Given 3 packs of lo loestrin, lot [id number] exp 1/17, to start Monday if QHCG negative, use condoms x 1 pack   Follow up prn

## 2014-08-05 LAB — BETA HCG QUANT (REF LAB): hCG Quant: 12 m[IU]/mL

## 2014-08-07 ENCOUNTER — Telehealth: Payer: Self-pay | Admitting: Adult Health

## 2014-08-07 NOTE — Telephone Encounter (Signed)
Left message QHCG 12 can start OCs today

## 2014-10-24 ENCOUNTER — Telehealth: Payer: Self-pay | Admitting: Adult Health

## 2014-10-24 NOTE — Telephone Encounter (Signed)
Spoke with pt. Pt states she would like a prescription of the Lo Loestrin called in. Thanks!! JSY

## 2014-10-25 MED ORDER — NORETHIN-ETH ESTRAD-FE BIPHAS 1 MG-10 MCG / 10 MCG PO TABS: 1.0000 | ORAL_TABLET | Freq: Every day | ORAL | Status: DC

## 2014-10-25 NOTE — Telephone Encounter (Signed)
Lo loestrin rx'd

## 2014-10-26 ENCOUNTER — Telehealth: Payer: Self-pay | Admitting: Adult Health

## 2014-10-26 MED ORDER — NORETHINDRONE ACET-ETHINYL EST 1-20 MG-MCG PO TABS: 1.0000 | ORAL_TABLET | Freq: Every day | ORAL | Status: DC

## 2014-10-26 NOTE — Telephone Encounter (Signed)
Lo loestrin cost too much will rx generic junel 1-20

## 2015-08-20 ENCOUNTER — Other Ambulatory Visit: Payer: Self-pay | Admitting: Adult Health

## 2015-10-30 ENCOUNTER — Emergency Department (HOSPITAL_COMMUNITY)
Admission: EM | Admit: 2015-10-30 | Discharge: 2015-10-30 | Disposition: A | Discharge status: Home / Self Care | Payer: BLUE CROSS/BLUE SHIELD | Attending: Emergency Medicine | Admitting: Emergency Medicine

## 2015-10-30 ENCOUNTER — Emergency Department (HOSPITAL_COMMUNITY): Payer: BLUE CROSS/BLUE SHIELD

## 2015-10-30 ENCOUNTER — Encounter (HOSPITAL_COMMUNITY): Payer: Self-pay | Admitting: Cardiology

## 2015-10-30 DIAGNOSIS — Z79891 Long term (current) use of opiate analgesic: Secondary | ICD-10-CM | POA: Insufficient documentation

## 2015-10-30 DIAGNOSIS — M79601 Pain in right arm: Secondary | ICD-10-CM | POA: Diagnosis present

## 2015-10-30 DIAGNOSIS — S43004A Unspecified dislocation of right shoulder joint, initial encounter: Secondary | ICD-10-CM | POA: Diagnosis not present

## 2015-10-30 DIAGNOSIS — Z87891 Personal history of nicotine dependence: Secondary | ICD-10-CM | POA: Diagnosis not present

## 2015-10-30 DIAGNOSIS — M25511 Pain in right shoulder: Secondary | ICD-10-CM | POA: Insufficient documentation

## 2015-10-30 DIAGNOSIS — Z79899 Other long term (current) drug therapy: Secondary | ICD-10-CM | POA: Diagnosis not present

## 2015-10-30 MED ORDER — HYDROCODONE-ACETAMINOPHEN 5-325 MG PO TABS: 2.0000 | ORAL_TABLET | ORAL | 0 refills | Status: DC | PRN

## 2015-10-30 MED ORDER — IBUPROFEN 800 MG PO TABS: 800.0000 mg | ORAL_TABLET | Freq: Three times a day (TID) | ORAL | 0 refills | Status: DC

## 2015-10-30 NOTE — ED Triage Notes (Signed)
Dislocated right arm Sunday.  Here today with increasing shoulder pain.

## 2015-10-30 NOTE — ED Provider Notes (Signed)
[facility] DEPT Provider Note   CSN: [id number] Arrival date & time: 10/30/15  7829     History   Chief Complaint Chief Complaint  Patient presents with  . Arm Pain    HPI Lindsay Nichols is a 35 y.o. female.  The history is provided by the patient. No language interpreter was used.  Arm Pain  This is a new problem. The current episode started yesterday. The problem occurs constantly. The problem has been gradually worsening. Nothing aggravates the symptoms. Nothing relieves the symptoms. She has tried nothing for the symptoms.   Pt complains of pain in her right shoulder.  Pt reports she dislocated her shoulder on Sunday.  Pt reports she has had shoulder pain since. Pt complains of pain and tingling.  Pt has had shoulder pain in the past. Pt reports shoulder pops when she moves Past Medical History:  Diagnosis Date  . Anxiety   . Kidney stone   . Miscarriage 07/07/2014  . Pelvic pain in female 07/11/2014  . PID (pelvic inflammatory disease)   . Pregnant 06/13/2014  . RLQ abdominal pain 11/01/2013  . Subchorionic hemorrhage in first trimester 06/21/2014  . URI (upper respiratory infection) 06/27/2014    Patient Active Problem List   Diagnosis Date Noted  . Pelvic pain in female 07/11/2014  . Miscarriage 07/07/2014  . URI (upper respiratory infection) 06/27/2014  . Subchorionic hemorrhage in first trimester 06/21/2014  . Pregnant 06/13/2014  . RLQ abdominal pain 11/01/2013  . PID (pelvic inflammatory disease) 10/27/2013  . Contraceptive management 10/27/2013    Past Surgical History:  Procedure Laterality Date  . BACK SURGERY    . SHOULDER SURGERY    . TONSILLECTOMY      OB History    Gravida Para Term Preterm AB Living   5 3     1 3    SAB TAB Ectopic Multiple Live Births   1               Home Medications    Prior to Admission medications   Medication Sig Start Date End Date Taking? Authorizing Provider  acetaminophen (TYLENOL) 500 MG tablet Take 500  mg by mouth every 6 (six) hours as needed.   Yes Historical Provider, MD  escitalopram (LEXAPRO) 20 MG tablet Take 10 mg by mouth every other day.  07/07/14  Yes Historical Provider, MD  Antony Contras 1/20 1-20 MG-MCG tablet TAKE ONE TABLET BY MOUTH ONCE DAILY 08/21/15  Yes Adline Potter, NP  naproxen sodium (ANAPROX) 220 MG tablet Take 440 mg by mouth 2 (two) times daily with a meal.   Yes Historical Provider, MD  zolpidem (AMBIEN) 10 MG tablet 10 mg at bedtime as needed.  07/07/14  Yes Historical Provider, MD    Family History Family History  Problem Relation Age of Onset  . Stroke Mother   . Other Mother     brain aneursym  . Hypertension Mother   . Lupus Mother   . COPD Father   . Lupus Sister   . Cancer Maternal Grandmother     ovarian  . Other Maternal Grandfather     brain aneursym  . Other Sister     brain aneursym  . Other Paternal Grandmother     hardening of arteries  . Stroke Paternal Grandfather     Social History Social History  Substance Use Topics  . Smoking status: Former Smoker    Types: Cigarettes  . Smokeless tobacco: Never Used  . Alcohol use  No     Allergies   Review of patient's allergies indicates no known allergies.   Review of Systems Review of Systems  All other systems reviewed and are negative.    Physical Exam Updated Vital Signs BP 117/65 (BP Location: Left Arm)   Pulse (!) 121   Temp 97.9 F (36.6 C) (Oral)   Resp 16   Ht 5\' 7"  (1.702 m)   Wt 127 kg   LMP 10/09/2015   SpO2 100%   BMI 43.85 kg/m   Physical Exam  Constitutional: She is oriented to person, place, and time. She appears well-developed and well-nourished.  HENT:  Head: Normocephalic.  Eyes: Pupils are equal, round, and reactive to light.  Neck: Normal range of motion.  Cardiovascular: Normal rate.   Pulmonary/Chest: Effort normal.  Abdominal: Soft.  Musculoskeletal: She exhibits tenderness.  Tender right shoulder,  Pain with moving,  Decreased range of motion   Neurological: She is alert and oriented to person, place, and time.  Skin: Skin is warm. Capillary refill takes less than 2 seconds.  Nursing note and vitals reviewed.    ED Treatments / Results  Labs (all labs ordered are listed, but only abnormal results are displayed) Labs Reviewed - No data to display  EKG  EKG Interpretation None       Radiology Dg Shoulder Right  Result Date: 10/30/2015 CLINICAL DATA:  Dislocation of the right shoulder 2 days ago, diffuse pain EXAM: RIGHT SHOULDER - 2+ VIEW COMPARISON:  None. FINDINGS: The right humeral head is in normal position and the glenohumeral joint space is unremarkable. The right [facility] joint is normally aligned. No acute abnormality is seen. IMPRESSION: Negative. Electronically Signed   By: Dwyane DeePaul  Barry M.D.   On: 10/30/2015 10:29    Procedures Procedures (including critical care time)  Medications Ordered in ED Medications - No data to display   Initial Impression / Assessment and Plan / ED Course  I have reviewed the triage vital signs and the nursing notes.  Pertinent labs & imaging results that were available during my care of the patient were reviewed by me and considered in my medical decision making (see chart for details).  Clinical Course    Xray no acute.  Pt placed in a shoulder imbolizer.   I advised her to follow up with Dr. Janee Mornthompson Orthopaedist on call.    Final Clinical Impressions(s) / ED Diagnoses   Final diagnoses:  Shoulder pain, right    New Prescriptions New Prescriptions   HYDROCODONE-ACETAMINOPHEN (NORCO/VICODIN) 5-325 MG TABLET    Take 2 tablets by mouth every 4 (four) hours as needed.   IBUPROFEN (ADVIL,MOTRIN) 800 MG TABLET    Take 1 tablet (800 mg total) by mouth 3 (three) times daily.  An After Visit Summary was printed and given to the patient.   Lonia SkinnerLeslie K JulianSofia, PA-C 10/30/15 1141    Blane OharaJoshua Zavitz, MD 11/01/15 [phone]

## 2016-03-14 DIAGNOSIS — Z6841 Body Mass Index (BMI) 40.0 and over, adult: Secondary | ICD-10-CM | POA: Diagnosis not present

## 2016-03-14 DIAGNOSIS — Z803 Family history of malignant neoplasm of breast: Secondary | ICD-10-CM | POA: Diagnosis not present

## 2016-03-14 DIAGNOSIS — Z1389 Encounter for screening for other disorder: Secondary | ICD-10-CM | POA: Diagnosis not present

## 2016-06-20 DIAGNOSIS — Z6841 Body Mass Index (BMI) 40.0 and over, adult: Secondary | ICD-10-CM | POA: Diagnosis not present

## 2016-06-20 DIAGNOSIS — L255 Unspecified contact dermatitis due to plants, except food: Secondary | ICD-10-CM | POA: Diagnosis not present

## 2016-07-11 ENCOUNTER — Other Ambulatory Visit (HOSPITAL_COMMUNITY): Payer: Self-pay | Admitting: Family Medicine

## 2016-07-11 DIAGNOSIS — Z1231 Encounter for screening mammogram for malignant neoplasm of breast: Secondary | ICD-10-CM

## 2016-07-17 ENCOUNTER — Ambulatory Visit (HOSPITAL_COMMUNITY)
Admission: RE | Admit: 2016-07-17 | Discharge: 2016-07-17 | Disposition: A | Discharge status: Home / Self Care | Payer: BLUE CROSS/BLUE SHIELD | Source: Ambulatory Visit | Attending: Family Medicine | Admitting: Family Medicine

## 2016-07-17 ENCOUNTER — Encounter (HOSPITAL_COMMUNITY): Payer: Self-pay

## 2016-07-17 DIAGNOSIS — Z1231 Encounter for screening mammogram for malignant neoplasm of breast: Secondary | ICD-10-CM | POA: Diagnosis not present

## 2016-07-23 ENCOUNTER — Other Ambulatory Visit: Payer: Self-pay | Admitting: Family Medicine

## 2016-07-23 ENCOUNTER — Other Ambulatory Visit (HOSPITAL_COMMUNITY): Payer: Self-pay | Admitting: Family Medicine

## 2016-07-23 DIAGNOSIS — N6310 Unspecified lump in the right breast, unspecified quadrant: Secondary | ICD-10-CM

## 2016-07-24 ENCOUNTER — Ambulatory Visit (HOSPITAL_COMMUNITY): Payer: Self-pay

## 2016-07-29 ENCOUNTER — Ambulatory Visit
Admission: RE | Admit: 2016-07-29 | Discharge: 2016-07-29 | Disposition: A | Discharge status: Home / Self Care | Payer: BLUE CROSS/BLUE SHIELD | Source: Ambulatory Visit | Attending: Family Medicine | Admitting: Family Medicine

## 2016-07-29 DIAGNOSIS — N6310 Unspecified lump in the right breast, unspecified quadrant: Secondary | ICD-10-CM

## 2016-07-29 DIAGNOSIS — N6311 Unspecified lump in the right breast, upper outer quadrant: Secondary | ICD-10-CM | POA: Diagnosis not present

## 2016-07-29 DIAGNOSIS — R928 Other abnormal and inconclusive findings on diagnostic imaging of breast: Secondary | ICD-10-CM | POA: Diagnosis not present

## 2016-08-15 DIAGNOSIS — L089 Local infection of the skin and subcutaneous tissue, unspecified: Secondary | ICD-10-CM | POA: Diagnosis not present

## 2016-08-15 DIAGNOSIS — Z6841 Body Mass Index (BMI) 40.0 and over, adult: Secondary | ICD-10-CM | POA: Diagnosis not present

## 2016-08-15 DIAGNOSIS — Z1389 Encounter for screening for other disorder: Secondary | ICD-10-CM | POA: Diagnosis not present

## 2017-02-10 ENCOUNTER — Telehealth: Payer: Self-pay | Admitting: *Deleted

## 2017-02-10 ENCOUNTER — Encounter: Payer: Self-pay | Admitting: Obstetrics & Gynecology

## 2017-02-10 ENCOUNTER — Ambulatory Visit: Payer: BLUE CROSS/BLUE SHIELD | Admitting: Obstetrics & Gynecology

## 2017-02-10 VITALS — BP 120/80 | HR 98 | Ht 67.0 in | Wt 285.0 lb

## 2017-02-10 DIAGNOSIS — N730 Acute parametritis and pelvic cellulitis: Secondary | ICD-10-CM | POA: Diagnosis not present

## 2017-02-10 DIAGNOSIS — Z3202 Encounter for pregnancy test, result negative: Secondary | ICD-10-CM | POA: Diagnosis not present

## 2017-02-10 MED ORDER — CEFTRIAXONE SODIUM 250 MG IJ SOLR: 250.0000 mg | Freq: Once | INTRAMUSCULAR | Status: DC

## 2017-02-10 MED ORDER — CEFTRIAXONE SODIUM 250 MG IJ SOLR
1000.0000 mg | Freq: Once | INTRAMUSCULAR | Status: AC
  Administered 2017-02-10: 1000 mg via INTRAMUSCULAR

## 2017-02-10 MED ORDER — SILVER SULFADIAZINE 1 % EX CREA: TOPICAL_CREAM | CUTANEOUS | 11 refills | Status: DC

## 2017-02-10 NOTE — Progress Notes (Signed)
Pt given Rocephin 1g IM Right VG without complications. Pt states that she has had this shot before with no reaction so she was not required to wait 15 min.

## 2017-02-10 NOTE — Telephone Encounter (Signed)
Patient called stating she is 100% certain she has had a miscarriage and is now experiencing a pelvic infection. She was prescribed Doxycycline over the weekend but states she doesn't think it is working and is now starting to have nausea. She is requesting a shot of Rocephin. Informed patient that since we have not seen her in about 2 years that she would be seen. Verbalized understanding, will be here at 3pm.

## 2017-02-10 NOTE — Progress Notes (Signed)
Chief Complaint  Patient presents with  . Pelvic Pain    vaginal bleeding Saturday and Sunday spotted early part of week/ miscarriage      36  y.o. [id number] Patient's last menstrual period was 01/16/2017. The current method of family planning is none.  Outpatient Encounter Medications as of 02/10/2017  Medication Sig  . acetaminophen (TYLENOL) 500 MG tablet Take 500 mg by mouth every 6 (six) hours as needed.  . doxycycline (DORYX) 100 MG EC tablet Take 100 mg by mouth 2 (two) times daily.  Marland Kitchen. ibuprofen (ADVIL,MOTRIN) 800 MG tablet Take 1 tablet (800 mg total) by mouth 3 (three) times daily.  Marland Kitchen. escitalopram (LEXAPRO) 20 MG tablet Take 10 mg by mouth every other day.   Marland Kitchen. GILDESS 1/20 1-20 MG-MCG tablet TAKE ONE TABLET BY MOUTH ONCE DAILY (Patient not taking: Reported on 02/10/2017)  . HYDROcodone-acetaminophen (NORCO/VICODIN) 5-325 MG tablet Take 2 tablets by mouth every 4 (four) hours as needed. (Patient not taking: Reported on 02/10/2017)  . naproxen sodium (ANAPROX) 220 MG tablet Take 440 mg by mouth 2 (two) times daily with a meal.  . silver sulfADIAZINE (SILVADENE) 1 % cream Use to area 3-4 times per day  . zolpidem (AMBIEN) 10 MG tablet 10 mg at bedtime as needed.   . [EXPIRED] cefTRIAXone (ROCEPHIN) injection 1,000 mg   . [DISCONTINUED] cefTRIAXone (ROCEPHIN) injection 250 mg    No facility-administered encounter medications on file as of 02/10/2017.     Subjective Lindsay Nichols presents complaining of bleeding this past weekend with a great deal of pain Believes she experienced a pregnancy loss, UPT here is negative Low grade fever Pain is entire pelvis all the way across No specific tissue seen Past Medical History:  Diagnosis Date  . Anxiety   . Kidney stone   . Miscarriage 07/07/2014  . Pelvic pain in female 07/11/2014  . PID (pelvic inflammatory disease)   . Pregnant 06/13/2014  . RLQ abdominal pain 11/01/2013  . Subchorionic hemorrhage in first trimester  06/21/2014  . URI (upper respiratory infection) 06/27/2014    Past Surgical History:  Procedure Laterality Date  . BACK SURGERY    . SHOULDER SURGERY    . TONSILLECTOMY      OB History    Gravida  5   Para  3   Term      Preterm      AB  1   Living  3     SAB  1   TAB      Ectopic      Multiple      Live Births              No Known Allergies  Social History   Socioeconomic History  . Marital status: Married    Spouse name: Not on file  . Number of children: Not on file  . Years of education: Not on file  . Highest education level: Not on file  Occupational History  . Not on file  Social Needs  . Financial resource strain: Not on file  . Food insecurity:    Worry: Not on file    Inability: Not on file  . Transportation needs:    Medical: Not on file    Non-medical: Not on file  Tobacco Use  . Smoking status: Former Smoker    Types: Cigarettes  . Smokeless tobacco: Never Used  Substance and Sexual Activity  . Alcohol use: No  . Drug use:  No    Frequency: 3.0 times per week  . Sexual activity: Yes    Birth control/protection: None  Lifestyle  . Physical activity:    Days per week: Not on file    Minutes per session: Not on file  . Stress: Not on file  Relationships  . Social connections:    Talks on phone: Not on file    Gets together: Not on file    Attends religious service: Not on file    Active member of club or organization: Not on file    Attends meetings of clubs or organizations: Not on file    Relationship status: Not on file  Other Topics Concern  . Not on file  Social History Narrative  . Not on file    Family History  Problem Relation Age of Onset  . Stroke Mother   . Other Mother        brain aneursym  . Hypertension Mother   . Lupus Mother   . COPD Father   . Lupus Sister   . Breast cancer Sister   . Cancer Maternal Grandmother        ovarian  . Other Maternal Grandfather        brain aneursym  . Other  Sister        brain aneursym  . Other Paternal Grandmother        hardening of arteries  . Stroke Paternal Grandfather   . Breast cancer Sister     Medications:       Current Outpatient Medications:  .  acetaminophen (TYLENOL) 500 MG tablet, Take 500 mg by mouth every 6 (six) hours as needed., Disp: , Rfl:  .  doxycycline (DORYX) 100 MG EC tablet, Take 100 mg by mouth 2 (two) times daily., Disp: , Rfl:  .  ibuprofen (ADVIL,MOTRIN) 800 MG tablet, Take 1 tablet (800 mg total) by mouth 3 (three) times daily., Disp: 21 tablet, Rfl: 0 .  escitalopram (LEXAPRO) 20 MG tablet, Take 10 mg by mouth every other day. , Disp: , Rfl: 2 .  GILDESS 1/20 1-20 MG-MCG tablet, TAKE ONE TABLET BY MOUTH ONCE DAILY (Patient not taking: Reported on 02/10/2017), Disp: 21 tablet, Rfl: 2 .  HYDROcodone-acetaminophen (NORCO/VICODIN) 5-325 MG tablet, Take 2 tablets by mouth every 4 (four) hours as needed. (Patient not taking: Reported on 02/10/2017), Disp: 10 tablet, Rfl: 0 .  naproxen sodium (ANAPROX) 220 MG tablet, Take 440 mg by mouth 2 (two) times daily with a meal., Disp: , Rfl:  .  silver sulfADIAZINE (SILVADENE) 1 % cream, Use to area 3-4 times per day, Disp: 50 g, Rfl: 11 .  zolpidem (AMBIEN) 10 MG tablet, 10 mg at bedtime as needed. , Disp: , Rfl: 2  Objective Blood pressure 120/80, pulse 98, height 5\' 7"  (1.702 m), weight 285 lb (129.3 kg), last menstrual period 01/16/2017.  General WDWN female NAD Vulva:  normal appearing vulva with no masses, tenderness or lesions Vagina:  normal mucosa, no discharge, no blood Cervix:  cervical motion tenderness Uterus:  tender and NSSC Adnexa: ovaries:present,  tenderness bilateral   Pertinent ROS No burning with urination, frequency or urgency No nausea, vomiting or diarrhea Nor fever chills or other constitutional symptoms   Labs or studies     Impression Diagnoses this Encounter::   ICD-10-CM   1. PID (acute pelvic inflammatory disease) N73.0  cefTRIAXone (ROCEPHIN) injection 1,000 mg    DISCONTINUED: cefTRIAXone (ROCEPHIN) injection 250 mg  2. Pregnancy examination or  test, negative result Z32.02 POCT urine pregnancy    Established relevant diagnosis(es):   Plan/Recommendations: Meds ordered this encounter  Medications  . silver sulfADIAZINE (SILVADENE) 1 % cream    Sig: Use to area 3-4 times per day    Dispense:  50 g    Refill:  11  . DISCONTD: cefTRIAXone (ROCEPHIN) injection 250 mg  . cefTRIAXone (ROCEPHIN) injection 1,000 mg    Labs or Scans Ordered: Orders Placed This Encounter  Procedures  . POCT urine pregnancy    Management:: Antibiotic therapy for PID  Follow up Return if symptoms worsen or fail to improve.        All questions were answered.

## 2017-02-12 DIAGNOSIS — Z6841 Body Mass Index (BMI) 40.0 and over, adult: Secondary | ICD-10-CM | POA: Diagnosis not present

## 2017-02-12 DIAGNOSIS — Z1389 Encounter for screening for other disorder: Secondary | ICD-10-CM | POA: Diagnosis not present

## 2017-02-12 DIAGNOSIS — N710 Acute inflammatory disease of uterus: Secondary | ICD-10-CM | POA: Diagnosis not present

## 2017-03-02 DIAGNOSIS — Z1389 Encounter for screening for other disorder: Secondary | ICD-10-CM | POA: Diagnosis not present

## 2017-03-02 DIAGNOSIS — J209 Acute bronchitis, unspecified: Secondary | ICD-10-CM | POA: Diagnosis not present

## 2017-03-02 DIAGNOSIS — J019 Acute sinusitis, unspecified: Secondary | ICD-10-CM | POA: Diagnosis not present

## 2017-03-02 DIAGNOSIS — Z6841 Body Mass Index (BMI) 40.0 and over, adult: Secondary | ICD-10-CM | POA: Diagnosis not present

## 2017-11-16 ENCOUNTER — Other Ambulatory Visit: Payer: Self-pay | Admitting: Family Medicine

## 2017-11-16 DIAGNOSIS — Z1231 Encounter for screening mammogram for malignant neoplasm of breast: Secondary | ICD-10-CM

## 2017-11-18 DIAGNOSIS — Z681 Body mass index (BMI) 19 or less, adult: Secondary | ICD-10-CM | POA: Diagnosis not present

## 2017-11-18 DIAGNOSIS — R509 Fever, unspecified: Secondary | ICD-10-CM | POA: Diagnosis not present

## 2017-11-18 DIAGNOSIS — R0981 Nasal congestion: Secondary | ICD-10-CM | POA: Diagnosis not present

## 2017-11-18 DIAGNOSIS — Z1389 Encounter for screening for other disorder: Secondary | ICD-10-CM | POA: Diagnosis not present

## 2017-11-18 DIAGNOSIS — J019 Acute sinusitis, unspecified: Secondary | ICD-10-CM | POA: Diagnosis not present

## 2017-11-18 DIAGNOSIS — R05 Cough: Secondary | ICD-10-CM | POA: Diagnosis not present

## 2017-12-17 ENCOUNTER — Ambulatory Visit
Admission: RE | Admit: 2017-12-17 | Discharge: 2017-12-17 | Disposition: A | Discharge status: Home / Self Care | Payer: BLUE CROSS/BLUE SHIELD | Source: Ambulatory Visit | Attending: Family Medicine | Admitting: Family Medicine

## 2017-12-17 DIAGNOSIS — Z1231 Encounter for screening mammogram for malignant neoplasm of breast: Secondary | ICD-10-CM

## 2017-12-30 ENCOUNTER — Ambulatory Visit: Payer: BLUE CROSS/BLUE SHIELD | Admitting: Obstetrics and Gynecology

## 2017-12-30 ENCOUNTER — Other Ambulatory Visit: Payer: Self-pay

## 2017-12-30 ENCOUNTER — Encounter: Payer: Self-pay | Admitting: Obstetrics and Gynecology

## 2017-12-30 ENCOUNTER — Other Ambulatory Visit (HOSPITAL_COMMUNITY)
Admission: RE | Admit: 2017-12-30 | Discharge: 2017-12-30 | Disposition: A | Discharge status: Home / Self Care | Payer: BLUE CROSS/BLUE SHIELD | Source: Ambulatory Visit | Attending: Obstetrics and Gynecology | Admitting: Obstetrics and Gynecology

## 2017-12-30 VITALS — BP 118/70 | HR 85 | Ht 67.0 in | Wt 275.0 lb

## 2017-12-30 DIAGNOSIS — Z01419 Encounter for gynecological examination (general) (routine) without abnormal findings: Secondary | ICD-10-CM | POA: Diagnosis not present

## 2017-12-30 NOTE — Patient Instructions (Signed)

## 2017-12-30 NOTE — Progress Notes (Signed)
Lindsay Nichols is a 37 y.o. [phone] female here for a routine annual gynecologic exam.  She has no complaints   Denies abnormal vaginal bleeding, discharge, pelvic pain, problems with intercourse or other gynecologic concerns.    Gynecologic History Patient's last menstrual period was 12/09/2017. Contraception: none Last Pap: NA. Results were: normal Last mammogram: 2019. Results were: normal  Obstetric History OB History  Gravida Para Term Preterm AB Living  5 3     1 3   SAB TAB Ectopic Multiple Live Births  1            # Outcome Date GA Lbr Len/2nd Weight Sex Delivery Anes PTL Lv  5 Gravida           4 SAB           3 Para           2 Para           1 Para             Past Medical History:  Diagnosis Date  . Anxiety   . Kidney stone   . Miscarriage 07/07/2014  . Pelvic pain in female 07/11/2014  . PID (pelvic inflammatory disease)   . Pregnant 06/13/2014  . RLQ abdominal pain 11/01/2013  . Subchorionic hemorrhage in first trimester 06/21/2014  . URI (upper respiratory infection) 06/27/2014    Past Surgical History:  Procedure Laterality Date  . BACK SURGERY    . SHOULDER SURGERY    . TONSILLECTOMY      No current outpatient medications on file prior to visit.   No current facility-administered medications on file prior to visit.     No Known Allergies  Social History   Socioeconomic History  . Marital status: Married    Spouse name: Not on file  . Number of children: 3  . Years of education: Not on file  . Highest education level: Not on file  Occupational History  . Not on file  Social Needs  . Financial resource strain: Not on file  . Food insecurity:    Worry: Not on file    Inability: Not on file  . Transportation needs:    Medical: Not on file    Non-medical: Not on file  Tobacco Use  . Smoking status: Former Smoker    Types: Cigarettes  . Smokeless tobacco: Never Used  Substance and Sexual Activity  . Alcohol use: No  . Drug use: No    Frequency: 3.0 times per week  . Sexual activity: Yes    Birth control/protection: None  Lifestyle  . Physical activity:    Days per week: Not on file    Minutes per session: Not on file  . Stress: Not on file  Relationships  . Social connections:    Talks on phone: Not on file    Gets together: Not on file    Attends religious service: Not on file    Active member of club or organization: Not on file    Attends meetings of clubs or organizations: Not on file    Relationship status: Not on file  . Intimate partner violence:    Fear of current or ex partner: No    Emotionally abused: No    Physically abused: No    Forced sexual activity: No  Other Topics Concern  . Not on file  Social History Narrative  . Not on file    Family History  Problem Relation Age of  Onset  . Stroke Mother   . Other Mother        brain aneursym  . Hypertension Mother   . Lupus Mother   . COPD Father   . Lupus Sister   . Cancer Maternal Grandmother        ovarian  . Other Maternal Grandfather        brain aneursym  . Other Sister        brain aneursym  . Other Paternal Grandmother        hardening of arteries  . Stroke Paternal Grandfather   . Breast cancer Sister     The following portions of the patient's history were reviewed and updated as appropriate: allergies, current medications, past family history, past medical history, past social history, past surgical history and problem list.  Review of Systems Pertinent items noted in HPI and remainder of comprehensive ROS otherwise negative.   Objective:  BP 118/70 (BP Location: Left Arm, Patient Position: Sitting, Cuff Size: Normal)   Pulse 85   Ht 5\' 7"  (1.702 m)   Wt 275 lb (124.7 kg)   LMP 12/09/2017   BMI 43.07 kg/m  CONSTITUTIONAL: Well-developed, well-nourished female in no acute distress.  HENT:  Normocephalic, atraumatic, External right and left ear normal. Oropharynx is clear and moist EYES: Conjunctivae and EOM are  normal. Pupils are equal, round, and reactive to light. No scleral icterus.  NECK: Normal range of motion, supple, no masses.  Normal thyroid.  SKIN: Skin is warm and dry. No rash noted. Not diaphoretic. No erythema. No pallor. NEUROLGIC: Alert and oriented to person, place, and time. Normal reflexes, muscle tone coordination. No cranial nerve deficit noted. PSYCHIATRIC: Normal mood and affect. Normal behavior. Normal judgment and thought content. CARDIOVASCULAR: Normal heart rate noted, regular rhythm RESPIRATORY: Clear to auscultation bilaterally. Effort and breath sounds normal, no problems with respiration noted. BREASTS: Pt declined ABDOMEN: Soft, normal bowel sounds, no distention noted.  No tenderness, rebound or guarding.  PELVIC: Normal appearing external genitalia; normal appearing vaginal mucosa and cervix.  No abnormal discharge noted.  Pap smear obtained.  Normal uterine size, no other palpable masses, no uterine or adnexal tenderness. MUSCULOSKELETAL: Normal range of motion. No tenderness.  No cyanosis, clubbing, or edema.  2+ distal pulses.   Assessment:  Annual gynecologic examination with pap smear   Plan:  Will follow up results of pap smear and manage accordingly. Routine preventative health maintenance measures emphasized. Please refer to After Visit Summary for other counseling recommendations.    Hermina Staggers, MD, FACOG Attending Obstetrician & Gynecologist Center for [facility], [facility]

## 2018-01-01 DIAGNOSIS — Z0000 Encounter for general adult medical examination without abnormal findings: Secondary | ICD-10-CM | POA: Diagnosis not present

## 2018-01-01 DIAGNOSIS — G4701 Insomnia due to medical condition: Secondary | ICD-10-CM | POA: Diagnosis not present

## 2018-01-01 DIAGNOSIS — R079 Chest pain, unspecified: Secondary | ICD-10-CM | POA: Diagnosis not present

## 2018-01-01 DIAGNOSIS — Z0001 Encounter for general adult medical examination with abnormal findings: Secondary | ICD-10-CM | POA: Diagnosis not present

## 2018-01-01 DIAGNOSIS — Z6841 Body Mass Index (BMI) 40.0 and over, adult: Secondary | ICD-10-CM | POA: Diagnosis not present

## 2018-01-01 DIAGNOSIS — Z1389 Encounter for screening for other disorder: Secondary | ICD-10-CM | POA: Diagnosis not present

## 2018-01-01 LAB — CYTOLOGY - PAP
Diagnosis: NEGATIVE
HPV: DETECTED — AB

## 2018-01-04 ENCOUNTER — Encounter: Payer: Self-pay | Admitting: *Deleted

## 2018-01-15 ENCOUNTER — Ambulatory Visit (INDEPENDENT_AMBULATORY_CARE_PROVIDER_SITE_OTHER): Payer: BLUE CROSS/BLUE SHIELD | Admitting: Women's Health

## 2018-01-15 ENCOUNTER — Encounter: Payer: Self-pay | Admitting: Women's Health

## 2018-01-15 VITALS — BP 116/82 | HR 98 | Temp 98.0°F | Ht 67.0 in | Wt 278.5 lb

## 2018-01-15 DIAGNOSIS — Z3491 Encounter for supervision of normal pregnancy, unspecified, first trimester: Secondary | ICD-10-CM

## 2018-01-15 DIAGNOSIS — J069 Acute upper respiratory infection, unspecified: Secondary | ICD-10-CM | POA: Diagnosis not present

## 2018-01-15 DIAGNOSIS — R110 Nausea: Secondary | ICD-10-CM | POA: Diagnosis not present

## 2018-01-15 DIAGNOSIS — Z3A01 Less than 8 weeks gestation of pregnancy: Secondary | ICD-10-CM

## 2018-01-15 DIAGNOSIS — Z3201 Encounter for pregnancy test, result positive: Secondary | ICD-10-CM

## 2018-01-15 DIAGNOSIS — O09291 Supervision of pregnancy with other poor reproductive or obstetric history, first trimester: Secondary | ICD-10-CM | POA: Diagnosis not present

## 2018-01-15 LAB — POCT URINE PREGNANCY: Preg Test, Ur: POSITIVE — AB

## 2018-01-15 MED ORDER — AZITHROMYCIN 250 MG PO TABS: ORAL_TABLET | ORAL | 0 refills | Status: DC

## 2018-01-15 NOTE — Progress Notes (Signed)
   GYN VISIT Patient name: Lindsay Nichols Human MRN [id number]  Date of birth: 12-15-1980 Chief Complaint:   Possible Pregnancy (has had 2 + UPT at home; + headaches and cramping )  History of Present Illness:   Lindsay Nichols is a 37 y.o. [id number] Caucasian female at 7361w2d by LMP, being seen today for +HPT x 2. Certain LMP of 10/9, however was only 3 1/2d, when usually last 5d. Mild occ nausea. Hasn't started pnv yet. Reports headaches, cramping, trouble sleeping- would take melatonin and benadryl prior to pregnancy, now takes benadryl only. H/O miscarriage, had low progesterone, so requests it be checked. Reports cold on & off x 1 month, feels bad again, productive cough, sneezing. No h/o asthma. PCP had ordered CXR, hasn't gotten yet. Requests meds. FOB w/ twin sister w/ spina bifida.   Patient's last menstrual period was 12/09/2017. Review of Systems:   Pertinent items are noted in HPI Denies fever/chills, dizziness, headaches, visual disturbances, fatigue, shortness of breath, chest pain, abdominal pain, vomiting, abnormal vaginal discharge/itching/odor/irritation, problems with periods, bowel movements, urination, or intercourse unless otherwise stated above.  Pertinent History Reviewed:  Reviewed past medical,surgical, social, obstetrical and family history.  Reviewed problem list, medications and allergies. Physical Assessment:   Vitals:   01/15/18 1027  BP: 116/82  Pulse: 98  Temp: 98 F (36.7 C)  Weight: 278 lb 8 oz (126.3 kg)  Height: 5\' 7"  (1.702 m)  Body mass index is 43.62 kg/m.       Physical Examination:   General appearance: alert, well appearing, and in no distress  Mental status: alert, oriented to person, place, and time  Skin: warm & dry   Cardiovascular: normal heart rate noted  Respiratory: normal respiratory effort, no distress, expiratory wheezes all quadrants  Abdomen: soft, non-tender   Pelvic: examination not indicated  Extremities: no edema    Results for orders placed or performed in visit on 01/15/18 (from the past 24 hour(s))  POCT urine pregnancy   Collection Time: 01/15/18 10:34 AM  Result Value Ref Range   Preg Test, Ur Positive (A) Negative    Assessment & Plan:  1) 2261w2d by certain LMP of 10/9> will get dating u/s in 2wks, start pnv asap, check progesterone d/t h/o pregnancy loss per her request  2) URI> rx zpak, has good air movement in all fields, would hold off on CXR for right now  Meds:  Meds ordered this encounter  Medications  . azithromycin (ZITHROMAX Z-PAK) 250 MG tablet    Sig: Use as directed    Dispense:  6 each    Refill:  0    Order Specific Question:   Supervising Provider    Answer:   Duane LopeEURE, LUTHER H [[id number]]    Orders Placed This Encounter  Procedures  . US OB Comp Less 14 Wks  . Progesterone  . POCT urine pregnancy    Return in about 2 weeks (around 01/29/2018) for dating u/s.  Cheral MarkerKimberly R Booker CNM, [facility]NP[facility]BC 01/15/2018 11:10 AM

## 2018-01-15 NOTE — Patient Instructions (Addendum)
Lindsay Nichols, I greatly value your feedback.  If you receive a survey following your visit with [place] today, we appreciate you taking the time to fill it out.  Thanks, Joellyn Haff, CNM, WHNP-BC  For cold  Humidifier and saline nasal spray for nasal congestion  Regular robitussin, cough drops for cough  Warm salt water gargles for sore throat  Mucinex with lots of water to help you cough up the mucous in your chest if needed  Drink plenty of fluids and stay hydrated!  Wash your hands frequently.    Tips to Help You Sleep Better:   Get into a bedtime routine, try to do the same thing every night before going to bed to try to help your body wind down  Warm baths  Avoid caffeine for at least 3 hours before going to sleep   Keep your room at a slightly cooler temperature, can try running a fan  Turn off TV, lights, phone, electronics  Lots of pillows if needed to help you get comfortable  Lavender scented items can help you sleep. You can place lavender essential oil on a cotton ball and place under your pillowcase, or place in a diffuser. [employer] has a lavender scented sleep line (plug-ins, sprays, etc). Look in the pillow aisle for lavender scented pillows.   If none of the above things help, you can try 1/2 to 1 tablet of benadryl, unisom, or tylenol pm. Do not take this every night, only when you really need it.      Nausea & Vomiting  Have saltine crackers or pretzels by your bed and eat a few bites before you raise your head out of bed in the morning  Eat small frequent meals throughout the day instead of large meals  Drink plenty of fluids throughout the day to stay hydrated, just don't drink a lot of fluids with your meals.  This can make your stomach fill up faster making you feel sick  Do not brush your teeth right after you eat  Products with real ginger are good for nausea, like ginger ale and ginger hard candy Make sure it says made with real  ginger!  Sucking on sour candy like lemon heads is also good for nausea  If your prenatal vitamins make you nauseated, take them at night so you will sleep through the nausea  Sea Bands  If you feel like you need medicine for the nausea & vomiting please let us know  If you are unable to keep any fluids or food down please let us know   Constipation  Drink plenty of fluid, preferably water, throughout the day  Eat foods high in fiber such as fruits, vegetables, and grains  Exercise, such as walking, is a good way to keep your bowels regular  Drink warm fluids, especially warm prune juice, or decaf coffee  Eat a 1/2 cup of real oatmeal (not instant), 1/2 cup applesauce, and 1/2-1 cup warm prune juice every day  If needed, you may take Colace (docusate sodium) stool softener once or twice a day to help keep the stool soft. If you are pregnant, wait until you are out of your first trimester (12-14 weeks of pregnancy)  If you still are having problems with constipation, you may take Miralax once daily as needed to help keep your bowels regular.  If you are pregnant, wait until you are out of your first trimester (12-14 weeks of pregnancy)   First Trimester of Pregnancy The first  trimester of pregnancy is from week 1 until the end of week 12 (months 1 through 3). A week after a sperm fertilizes an egg, the egg will implant on the wall of the uterus. This embryo will begin to develop into a baby. Genes from you and your partner are forming the baby. The female genes determine whether the baby is a boy or a girl. At 6-8 weeks, the eyes and face are formed, and the heartbeat can be seen on ultrasound. At the end of 12 weeks, all the baby's organs are formed.  Now that you are pregnant, you will want to do everything you can to have a healthy baby. Two of the most important things are to get good prenatal care and to follow your health care provider's instructions. Prenatal care is all the medical  care you receive before the baby's birth. This care will help prevent, find, and treat any problems during the pregnancy and childbirth. BODY CHANGES Your body goes through many changes during pregnancy. The changes vary from woman to woman.   You may gain or lose a couple of pounds at first.  You may feel sick to your stomach (nauseous) and throw up (vomit). If the vomiting is uncontrollable, call your health care provider.  You may tire easily.  You may develop headaches that can be relieved by medicines approved by your health care provider.  You may urinate more often. Painful urination may mean you have a bladder infection.  You may develop heartburn as a result of your pregnancy.  You may develop constipation because certain hormones are causing the muscles that push waste through your intestines to slow down.  You may develop hemorrhoids or swollen, bulging veins (varicose veins).  Your breasts may begin to grow larger and become tender. Your nipples may stick out more, and the tissue that surrounds them (areola) may become darker.  Your gums may bleed and may be sensitive to brushing and flossing.  Dark spots or blotches (chloasma, mask of pregnancy) may develop on your face. This will likely fade after the baby is born.  Your menstrual periods will stop.  You may have a loss of appetite.  You may develop cravings for certain kinds of food.  You may have changes in your emotions from day to day, such as being excited to be pregnant or being concerned that something may go wrong with the pregnancy and baby.  You may have more vivid and strange dreams.  You may have changes in your hair. These can include thickening of your hair, rapid growth, and changes in texture. Some women also have hair loss during or after pregnancy, or hair that feels dry or thin. Your hair will most likely return to normal after your baby is born. WHAT TO EXPECT AT YOUR PRENATAL VISITS During a  routine prenatal visit:  You will be weighed to make sure you and the baby are growing normally.  Your blood pressure will be taken.  Your abdomen will be measured to track your baby's growth.  The fetal heartbeat will be listened to starting around week 10 or 12 of your pregnancy.  Test results from any previous visits will be discussed. Your health care provider may ask you:  How you are feeling.  If you are feeling the baby move.  If you have had any abnormal symptoms, such as leaking fluid, bleeding, severe headaches, or abdominal cramping.  If you have any questions. Other tests that may be performed during  your first trimester include:  Blood tests to find your blood type and to check for the presence of any previous infections. They will also be used to check for low iron levels (anemia) and Rh antibodies. Later in the pregnancy, blood tests for diabetes will be done along with other tests if problems develop.  Urine tests to check for infections, diabetes, or protein in the urine.  An ultrasound to confirm the proper growth and development of the baby.  An amniocentesis to check for possible genetic problems.  Fetal screens for spina bifida and Down syndrome.  You may need other tests to make sure you and the baby are doing well. HOME CARE INSTRUCTIONS  Medicines  Follow your health care provider's instructions regarding medicine use. Specific medicines may be either safe or unsafe to take during pregnancy.  Take your prenatal vitamins as directed.  If you develop constipation, try taking a stool softener if your health care provider approves. Diet  Eat regular, well-balanced meals. Choose a variety of foods, such as meat or vegetable-based protein, fish, milk and low-fat dairy products, vegetables, fruits, and whole grain breads and cereals. Your health care provider will help you determine the amount of weight gain that is right for you.  Avoid raw meat and  uncooked cheese. These carry germs that can cause birth defects in the baby.  Eating four or five small meals rather than three large meals a day may help relieve nausea and vomiting. If you start to feel nauseous, eating a few soda crackers can be helpful. Drinking liquids between meals instead of during meals also seems to help nausea and vomiting.  If you develop constipation, eat more high-fiber foods, such as fresh vegetables or fruit and whole grains. Drink enough fluids to keep your urine clear or pale yellow. Activity and Exercise  Exercise only as directed by your health care provider. Exercising will help you:  Control your weight.  Stay in shape.  Be prepared for labor and delivery.  Experiencing pain or cramping in the lower abdomen or low back is a good sign that you should stop exercising. Check with your health care provider before continuing normal exercises.  Try to avoid standing for long periods of time. Move your legs often if you must stand in one place for a long time.  Avoid heavy lifting.  Wear low-heeled shoes, and practice good posture.  You may continue to have sex unless your health care provider directs you otherwise. Relief of Pain or Discomfort  Wear a good support bra for breast tenderness.   Take warm sitz baths to soothe any pain or discomfort caused by hemorrhoids. Use hemorrhoid cream if your health care provider approves.   Rest with your legs elevated if you have leg cramps or low back pain.  If you develop varicose veins in your legs, wear support hose. Elevate your feet for 15 minutes, 3-4 times a day. Limit salt in your diet. Prenatal Care  Schedule your prenatal visits by the twelfth week of pregnancy. They are usually scheduled monthly at first, then more often in the last 2 months before delivery.  Write down your questions. Take them to your prenatal visits.  Keep all your prenatal visits as directed by your health care  provider. Safety  Wear your seat belt at all times when driving.  Make a list of emergency phone numbers, including numbers for family, friends, the hospital, and police and fire departments. General Tips  Ask your health care  provider for a referral to a local prenatal education class. Begin classes no later than at the beginning of month 6 of your pregnancy.  Ask for help if you have counseling or nutritional needs during pregnancy. Your health care provider can offer advice or refer you to specialists for help with various needs.  Do not use hot tubs, steam rooms, or saunas.  Do not douche or use tampons or scented sanitary pads.  Do not cross your legs for long periods of time.  Avoid cat litter boxes and soil used by cats. These carry germs that can cause birth defects in the baby and possibly loss of the fetus by miscarriage or stillbirth.  Avoid all smoking, herbs, alcohol, and medicines not prescribed by your health care provider. Chemicals in these affect the formation and growth of the baby.  Schedule a dentist appointment. At home, brush your teeth with a soft toothbrush and be gentle when you floss. SEEK MEDICAL CARE IF:   You have dizziness.  You have mild pelvic cramps, pelvic pressure, or nagging pain in the abdominal area.  You have persistent nausea, vomiting, or diarrhea.  You have a bad smelling vaginal discharge.  You have pain with urination.  You notice increased swelling in your face, hands, legs, or ankles. SEEK IMMEDIATE MEDICAL CARE IF:   You have a fever.  You are leaking fluid from your vagina.  You have spotting or bleeding from your vagina.  You have severe abdominal cramping or pain.  You have rapid weight gain or loss.  You vomit blood or material that looks like coffee grounds.  You are exposed to [place] measles and have never had them.  You are exposed to fifth disease or chickenpox.  You develop a severe headache.  You have  shortness of breath.  You have any kind of trauma, such as from a fall or a car accident. Document Released: 02/11/2001 Document Revised: 07/04/2013 Document Reviewed: 12/28/2012 [facility] Patient Information 2015 [place], [place]. This information is not intended to replace advice given to you by your health care provider. Make sure you discuss any questions you have with your health care provider.

## 2018-01-16 LAB — PROGESTERONE: Progesterone: 11.9 ng/mL

## 2018-01-18 ENCOUNTER — Other Ambulatory Visit: Payer: Self-pay | Admitting: Women's Health

## 2018-01-18 MED ORDER — PROGESTERONE MICRONIZED 200 MG PO CAPS: 200.0000 mg | ORAL_CAPSULE | Freq: Every day | ORAL | 5 refills | Status: DC

## 2018-01-19 ENCOUNTER — Other Ambulatory Visit (HOSPITAL_COMMUNITY): Payer: Self-pay | Admitting: Family Medicine

## 2018-01-19 DIAGNOSIS — R51 Headache: Principal | ICD-10-CM

## 2018-01-19 DIAGNOSIS — R519 Headache, unspecified: Secondary | ICD-10-CM

## 2018-01-25 ENCOUNTER — Other Ambulatory Visit: Payer: Self-pay | Admitting: Women's Health

## 2018-01-25 ENCOUNTER — Ambulatory Visit (INDEPENDENT_AMBULATORY_CARE_PROVIDER_SITE_OTHER): Payer: BLUE CROSS/BLUE SHIELD

## 2018-01-25 ENCOUNTER — Other Ambulatory Visit: Payer: BLUE CROSS/BLUE SHIELD

## 2018-01-25 DIAGNOSIS — Z3491 Encounter for supervision of normal pregnancy, unspecified, first trimester: Secondary | ICD-10-CM | POA: Diagnosis not present

## 2018-01-25 NOTE — Progress Notes (Signed)
US 6+5 wks,single IUP w/ys,positive fht 116 bpm,normal ovaries bilat,crl 4.1 mm

## 2018-01-26 ENCOUNTER — Other Ambulatory Visit: Payer: BLUE CROSS/BLUE SHIELD

## 2018-01-29 ENCOUNTER — Encounter (HOSPITAL_COMMUNITY): Payer: Self-pay

## 2018-01-29 ENCOUNTER — Ambulatory Visit (HOSPITAL_COMMUNITY): Admission: RE | Admit: 2018-01-29 | Payer: BLUE CROSS/BLUE SHIELD | Source: Ambulatory Visit

## 2018-02-04 ENCOUNTER — Encounter (HOSPITAL_COMMUNITY): Payer: Self-pay | Admitting: *Deleted

## 2018-02-04 ENCOUNTER — Inpatient Hospital Stay (HOSPITAL_COMMUNITY)
Admission: AD | Admit: 2018-02-04 | Discharge: 2018-02-04 | Disposition: A | Discharge status: Home / Self Care | Payer: BLUE CROSS/BLUE SHIELD | Attending: Obstetrics & Gynecology | Admitting: Obstetrics & Gynecology

## 2018-02-04 ENCOUNTER — Inpatient Hospital Stay (HOSPITAL_COMMUNITY): Payer: BLUE CROSS/BLUE SHIELD

## 2018-02-04 DIAGNOSIS — O4690 Antepartum hemorrhage, unspecified, unspecified trimester: Secondary | ICD-10-CM

## 2018-02-04 DIAGNOSIS — O208 Other hemorrhage in early pregnancy: Secondary | ICD-10-CM

## 2018-02-04 DIAGNOSIS — Z3A01 Less than 8 weeks gestation of pregnancy: Secondary | ICD-10-CM | POA: Diagnosis not present

## 2018-02-04 DIAGNOSIS — O209 Hemorrhage in early pregnancy, unspecified: Secondary | ICD-10-CM

## 2018-02-04 LAB — URINALYSIS, ROUTINE W REFLEX MICROSCOPIC
Bilirubin Urine: NEGATIVE
Glucose, UA: NEGATIVE mg/dL
Ketones, ur: NEGATIVE mg/dL
Nitrite: NEGATIVE
Protein, ur: NEGATIVE mg/dL
Specific Gravity, Urine: 1.024 (ref 1.005–1.030)
pH: 5 (ref 5.0–8.0)

## 2018-02-04 MED ORDER — ACETAMINOPHEN 500 MG PO TABS
1000.0000 mg | ORAL_TABLET | Freq: Once | ORAL | Status: AC
  Administered 2018-02-04: 1000 mg via ORAL
  Filled 2018-02-04: qty 2

## 2018-02-04 NOTE — Progress Notes (Signed)
Patient eager for discharge, signed printed AVS signature page.  Verbalized understanding of dc instructions.  Dc'd home in good condition.

## 2018-02-04 NOTE — MAU Note (Signed)
Patient started spotting around 1600 and noticed bright red on her tissue when wiping.  Denies bleeding now.  Denies sexual intercourse in past few days.

## 2018-02-04 NOTE — Discharge Instructions (Signed)

## 2018-02-04 NOTE — MAU Provider Note (Signed)
History     CSN: [id number]  Arrival date and time: 02/04/18 1810   None     Chief Complaint  Patient presents with  . Vaginal Bleeding   Lindsay Nichols presents with vaginal bleeding that has happened twice today She has a history of miscarriage and is concerned that she is losing this pregnancy as well Both episodes occurred after urination Denies a history of hemorrhoids Has had an [place] confirming IUP    Past Medical History:  Diagnosis Date  . Anxiety   . Kidney stone   . Miscarriage 07/07/2014  . Pelvic pain in female 07/11/2014  . PID (pelvic inflammatory disease)   . Pregnant 06/13/2014  . RLQ abdominal pain 11/01/2013  . Subchorionic hemorrhage in first trimester 06/21/2014  . URI (upper respiratory infection) 06/27/2014    Past Surgical History:  Procedure Laterality Date  . BACK SURGERY    . SHOULDER SURGERY    . TONSILLECTOMY      Family History  Problem Relation Age of Onset  . Stroke Mother   . Other Mother        brain aneursym  . Hypertension Mother   . Lupus Mother   . COPD Father   . Lupus Sister   . Cancer Maternal Grandmother        ovarian  . Other Maternal Grandfather        brain aneursym  . Other Sister        brain aneursym  . Other Paternal Grandmother        hardening of arteries  . Stroke Paternal Grandfather   . Breast cancer Sister     Social History   Tobacco Use  . Smoking status: Former Smoker    Types: Cigarettes  . Smokeless tobacco: Never Used  Substance Use Topics  . Alcohol use: No  . Drug use: No    Frequency: 3.0 times per week    Allergies: No Known Allergies  Medications Prior to Admission  Medication Sig Dispense Refill Last Dose  . acetaminophen (TYLENOL) 500 MG tablet Take 1,000 mg by mouth as needed.   Taking  . azithromycin (ZITHROMAX Z-PAK) 250 MG tablet Use as directed 6 each 0   . diphenhydrAMINE (BENADRYL) 25 MG tablet Take 50 mg by mouth at bedtime.   Taking  . famotidine (PEPCID) 20 MG tablet  Take 20 mg by mouth daily.   Taking  . guaiFENesin (ROBITUSSIN) 100 MG/5ML SOLN Take 20 mLs by mouth as needed for cough or to loosen phlegm.   Taking  . loratadine (CLARITIN) 10 MG tablet Take 10 mg by mouth daily.   Taking  . Multiple Vitamin (MULTIVITAMIN) tablet Take 1 tablet by mouth daily.   Taking  . Multiple Vitamins-Minerals (ZINC PO) Take by mouth daily.   Taking  . progesterone (PROMETRIUM) 200 MG capsule Place 1 capsule (200 mg total) vaginally at bedtime. 30 capsule 5   . vitamin C (ASCORBIC ACID) 500 MG tablet Take 1,000 mg by mouth daily.   Taking    Review of Systems  All other systems reviewed and are negative.  Physical Exam   Blood pressure (!) 124/55, pulse 100, temperature (!) 97.3 F (36.3 C), temperature source Oral, weight 130.1 kg, last menstrual period 12/09/2017, SpO2 98 %.  Physical Exam  Constitutional: She is oriented to person, place, and time. She appears well-developed and well-nourished. No distress.  HENT:  Head: Normocephalic and atraumatic.  Eyes: Pupils are equal, round, and reactive to light.  Conjunctivae are normal. Right eye exhibits no discharge. Left eye exhibits no discharge. No scleral icterus.  Cardiovascular: Normal rate and regular rhythm.  Respiratory: Effort normal. No respiratory distress.  Neurological: She is alert and oriented to person, place, and time.  Skin: She is not diaphoretic.  Psychiatric: She has a normal mood and affect. Her behavior is normal. Judgment and thought content normal.    MAU Course  Procedures  MDM Attempted to assess FHT with bedside US, however difficult to assess given early gestation Sent for formal US with positive heart tones   Assessment and Plan  Vaginal bleeding affecting early pregnancy - Plan: Discharge patient - confirmed viable IUP - follow up as scheduled with Ob - return precautions discussed    Gwenevere AbbotNimeka Mirielle Byrum  Ob Fellow 02/04/2018, 7:43 PM

## 2018-02-05 ENCOUNTER — Other Ambulatory Visit: Payer: Self-pay | Admitting: Women's Health

## 2018-02-05 DIAGNOSIS — Z3481 Encounter for supervision of other normal pregnancy, first trimester: Secondary | ICD-10-CM

## 2018-02-05 DIAGNOSIS — R319 Hematuria, unspecified: Secondary | ICD-10-CM

## 2018-02-10 ENCOUNTER — Other Ambulatory Visit: Payer: Self-pay

## 2018-02-10 ENCOUNTER — Ambulatory Visit: Payer: BLUE CROSS/BLUE SHIELD | Admitting: *Deleted

## 2018-02-10 ENCOUNTER — Other Ambulatory Visit (INDEPENDENT_AMBULATORY_CARE_PROVIDER_SITE_OTHER): Payer: BLUE CROSS/BLUE SHIELD

## 2018-02-10 ENCOUNTER — Ambulatory Visit (INDEPENDENT_AMBULATORY_CARE_PROVIDER_SITE_OTHER): Payer: BLUE CROSS/BLUE SHIELD | Admitting: Women's Health

## 2018-02-10 ENCOUNTER — Encounter: Payer: Self-pay | Admitting: Women's Health

## 2018-02-10 VITALS — BP 110/82 | HR 88 | Wt 288.0 lb

## 2018-02-10 DIAGNOSIS — O09529 Supervision of elderly multigravida, unspecified trimester: Secondary | ICD-10-CM | POA: Insufficient documentation

## 2018-02-10 DIAGNOSIS — Z1389 Encounter for screening for other disorder: Secondary | ICD-10-CM

## 2018-02-10 DIAGNOSIS — Z8759 Personal history of other complications of pregnancy, childbirth and the puerperium: Secondary | ICD-10-CM | POA: Insufficient documentation

## 2018-02-10 DIAGNOSIS — O09211 Supervision of pregnancy with history of pre-term labor, first trimester: Secondary | ICD-10-CM | POA: Diagnosis not present

## 2018-02-10 DIAGNOSIS — Z3490 Encounter for supervision of normal pregnancy, unspecified, unspecified trimester: Secondary | ICD-10-CM | POA: Insufficient documentation

## 2018-02-10 DIAGNOSIS — O09899 Supervision of other high risk pregnancies, unspecified trimester: Secondary | ICD-10-CM

## 2018-02-10 DIAGNOSIS — Z3481 Encounter for supervision of other normal pregnancy, first trimester: Secondary | ICD-10-CM

## 2018-02-10 DIAGNOSIS — Z3491 Encounter for supervision of normal pregnancy, unspecified, first trimester: Secondary | ICD-10-CM

## 2018-02-10 DIAGNOSIS — Z3A09 9 weeks gestation of pregnancy: Secondary | ICD-10-CM | POA: Diagnosis not present

## 2018-02-10 DIAGNOSIS — O09521 Supervision of elderly multigravida, first trimester: Secondary | ICD-10-CM | POA: Diagnosis not present

## 2018-02-10 DIAGNOSIS — Z862 Personal history of diseases of the blood and blood-forming organs and certain disorders involving the immune mechanism: Secondary | ICD-10-CM

## 2018-02-10 DIAGNOSIS — Z331 Pregnant state, incidental: Secondary | ICD-10-CM

## 2018-02-10 DIAGNOSIS — O09219 Supervision of pregnancy with history of pre-term labor, unspecified trimester: Secondary | ICD-10-CM

## 2018-02-10 LAB — POCT URINALYSIS DIPSTICK OB
Glucose, UA: NEGATIVE
Ketones, UA: NEGATIVE
Leukocytes, UA: NEGATIVE
Nitrite, UA: NEGATIVE
POC,PROTEIN,UA: NEGATIVE

## 2018-02-10 NOTE — Progress Notes (Signed)
INITIAL OBSTETRICAL VISIT Patient name: Lindsay Nichols MRN [id number]  Date of birth: 1980/03/20 Chief Complaint:   Initial Prenatal Visit  History of Present Illness:   Lindsay Nichols is a 37 y.o. [id number] Caucasian female at 2758w0d by LMP c/w 6wk u/s, with an Estimated Date of Delivery: 09/15/18 being seen today for her initial obstetrical visit.   Her obstetrical history is significant for SAB x 2, term SVB x 3, reports h/o PPH last delivery- didn't require transfusion.  [facility], had u/s which showed CRL 651w4d at time she was 487w1d, so she has been concerned about this discrepancy. She is on prometrium for low progesterone. Wants cfDNA.  Today she reports sciatica.  Patient's last menstrual period was 12/09/2017. Last pap 12/30/17. Results were: neg w/ +HRHPV Review of Systems:   Pertinent items are noted in HPI Denies cramping/contractions, leakage of fluid, vaginal bleeding, abnormal vaginal discharge w/ itching/odor/irritation, headaches, visual changes, shortness of breath, chest pain, abdominal pain, severe nausea/vomiting, or problems with urination or bowel movements unless otherwise stated above.  Pertinent History Reviewed:  Reviewed past medical,surgical, social, obstetrical and family history.  Reviewed problem list, medications and allergies. OB History  Gravida Para Term Preterm AB Living  6 3 3   2 3   SAB TAB Ectopic Multiple Live Births  1       3    # Outcome Date GA Lbr Len/2nd Weight Sex Delivery Anes PTL Lv  6 Current           5 SAB 2016          4 Term 09/20/08 4823w0d  7 lb (3.175 kg) M Vag-Spont EPI N LIV  3 Term 11/28/03 4732w2d  6 lb 14 oz (3.118 kg) F Vag-Spont EPI N LIV  2 AB 2003          1 Term 05/17/98 639w0d  5 lb (2.268 kg) F Vag-Spont EPI N LIV   Physical Assessment:   Vitals:   02/10/18 0855  BP: 110/82  Pulse: 88  Weight: 288 lb (130.6 kg)  Body mass index is 45.11 kg/m.       Physical Examination:  General  appearance - well appearing, and in no distress  Mental status - alert, oriented to person, place, and time  Psych:  She has a normal mood and affect  Skin - warm and dry, normal color, no suspicious lesions noted  Chest - effort normal, all lung fields clear to auscultation bilaterally  Heart - normal rate and regular rhythm  Abdomen - soft, nontender  Extremities:  No swelling or varicosities noted  Pelvic - VULVA: normal appearing vulva with no masses, tenderness or lesions  VAGINA: normal appearing vagina with normal color and discharge, no lesions  CERVIX: normal appearing cervix without discharge or lesions, no CMT  Thin prep pap is not done   Fetal Heart Rate (bpm): +u/s via informal transabdominal u/s Worked in [employer] research [employer], [employer]/ Amber for formal u/s d/t discrepancy of CRL: US 9 wks,crl 21.15 mm c/w 3364w5d, fhr 161 bpm,normal ovaries bilat,GS sac size discrepancy,GS  23.4 mm=7+2 wks  Results for orders placed or performed in visit on 02/10/18 (from the past 24 hour(s))  POC Urinalysis Dipstick OB   Collection Time: 02/10/18  9:52 AM  Result Value Ref Range   Color, UA     Clarity, UA     Glucose, UA Negative Negative   Bilirubin, UA     Ketones, UA neg  Spec Grav, UA     Blood, UA trace    pH, UA     POC,PROTEIN,UA Negative Negative, Trace, Small (1+), Moderate (2+), Large (3+), 4+   Urobilinogen, UA     Nitrite, UA neg    Leukocytes, UA Negative Negative   Appearance     Odor      Assessment & Plan:  1) Low-Risk Pregnancy [id number] at [redacted]w[redacted]d with an Estimated Date of Delivery: 09/15/18   2) Initial OB visit  3) CRL c/w [redacted]w[redacted]d, GS c/w [redacted]w[redacted]d, good FHR, fetus has grown appropriately since last u/s here 11/25. Discussed discrepancy b/w GS and CRL is concerning, however baby has good heartbeat today. Continue vaginal prometrium (for low progesterone) until 14wks  4) AMA 37yo> wants cfDNA and nt/it  5) H/O PPH> x 1, delivery note not in epic  Meds: No orders of the defined types were placed in  this encounter.  Initial labs obtained Continue prenatal vitamins Reviewed n/v relief measures and warning s/s to report Reviewed recommended weight gain based on pre-gravid BMI Encouraged well-balanced diet Genetic Screening discussed Integrated Screen & cfDNA: requested Cystic fibrosis screening discussed declined Ultrasound discussed; fetal survey: requested CCNC completed>not applying for pregnancy mcaid Has already had flu shot  Follow-up: Return for 4wks for LROB and , US:NT+1stIT.   Orders Placed This Encounter  Procedures  . GC/Chlamydia Probe Amp  . Urine Culture  . US OB Comp Less 14 Wks  . Obstetric Panel, Including HIV  . Urinalysis, Routine w reflex microscopic  . Pain Management Screening Profile (10S)  . POC Urinalysis Dipstick OB    Cheral Marker CNM, [facility] 02/10/2018 1:22 PM

## 2018-02-10 NOTE — Patient Instructions (Signed)
Lindsay Nichols, I greatly value your feedback.  If you receive a survey following your visit with us today, we appreciate you taking the time to fill it out.  Thanks, [name], CNM, WHNP-BC   Nausea & Vomiting  Have saltine crackers or pretzels by your bed and eat a few bites before you raise your head out of bed in the morning  Eat small frequent meals throughout the day instead of large meals  Drink plenty of fluids throughout the day to stay hydrated, just don't drink a lot of fluids with your meals.  This can make your stomach fill up faster making you feel sick  Do not brush your teeth right after you eat  Products with real ginger are good for nausea, like ginger ale and ginger hard candy Make sure it says made with real ginger!  Sucking on sour candy like lemon heads is also good for nausea  If your prenatal vitamins make you nauseated, take them at night so you will sleep through the nausea  Sea Bands  If you feel like you need medicine for the nausea & vomiting please let us know  If you are unable to keep any fluids or food down please let us know   Constipation  Drink plenty of fluid, preferably water, throughout the day  Eat foods high in fiber such as fruits, vegetables, and grains  Exercise, such as walking, is a good way to keep your bowels regular  Drink warm fluids, especially warm prune juice, or decaf coffee  Eat a 1/2 cup of real oatmeal (not instant), 1/2 cup applesauce, and 1/2-1 cup warm prune juice every day  If needed, you may take Colace (docusate sodium) stool softener once or twice a day to help keep the stool soft. If you are pregnant, wait until you are out of your first trimester (12-14 weeks of pregnancy)  If you still are having problems with constipation, you may take Miralax once daily as needed to help keep your bowels regular.  If you are pregnant, wait until you are out of your first trimester (12-14 weeks of pregnancy)   First  Trimester of Pregnancy The first trimester of pregnancy is from week 1 until the end of week 12 (months 1 through 3). A week after a sperm fertilizes an egg, the egg will implant on the wall of the uterus. This embryo will begin to develop into a baby. Genes from you and your partner are forming the baby. The female genes determine whether the baby is a boy or a girl. At 6-8 weeks, the eyes and face are formed, and the heartbeat can be seen on ultrasound. At the end of 12 weeks, all the baby's organs are formed.  Now that you are pregnant, you will want to do everything you can to have a healthy baby. Two of the most important things are to get good prenatal care and to follow your health care provider's instructions. Prenatal care is all the medical care you receive before the baby's birth. This care will help prevent, find, and treat any problems during the pregnancy and childbirth. BODY CHANGES Your body goes through many changes during pregnancy. The changes vary from woman to woman.   You may gain or lose a couple of pounds at first.  You may feel sick to your stomach (nauseous) and throw up (vomit). If the vomiting is uncontrollable, call your health care provider.  You may tire easily.  You may develop headaches  that can be relieved by medicines approved by your health care provider.  You may urinate more often. Painful urination may mean you have a bladder infection.  You may develop heartburn as a result of your pregnancy.  You may develop constipation because certain hormones are causing the muscles that push waste through your intestines to slow down.  You may develop hemorrhoids or swollen, bulging veins (varicose veins).  Your breasts may begin to grow larger and become tender. Your nipples may stick out more, and the tissue that surrounds them (areola) may become darker.  Your gums may bleed and may be sensitive to brushing and flossing.  Dark spots or blotches (chloasma, mask  of pregnancy) may develop on your face. This will likely fade after the baby is born.  Your menstrual periods will stop.  You may have a loss of appetite.  You may develop cravings for certain kinds of food.  You may have changes in your emotions from day to day, such as being excited to be pregnant or being concerned that something may go wrong with the pregnancy and baby.  You may have more vivid and strange dreams.  You may have changes in your hair. These can include thickening of your hair, rapid growth, and changes in texture. Some women also have hair loss during or after pregnancy, or hair that feels dry or thin. Your hair will most likely return to normal after your baby is born. WHAT TO EXPECT AT YOUR PRENATAL VISITS During a routine prenatal visit:  You will be weighed to make sure you and the baby are growing normally.  Your blood pressure will be taken.  Your abdomen will be measured to track your baby's growth.  The fetal heartbeat will be listened to starting around week 10 or 12 of your pregnancy.  Test results from any previous visits will be discussed. Your health care provider may ask you:  How you are feeling.  If you are feeling the baby move.  If you have had any abnormal symptoms, such as leaking fluid, bleeding, severe headaches, or abdominal cramping.  If you have any questions. Other tests that may be performed during your first trimester include:  Blood tests to find your blood type and to check for the presence of any previous infections. They will also be used to check for low iron levels (anemia) and Rh antibodies. Later in the pregnancy, blood tests for diabetes will be done along with other tests if problems develop.  Urine tests to check for infections, diabetes, or protein in the urine.  An ultrasound to confirm the proper growth and development of the baby.  An amniocentesis to check for possible genetic problems.  Fetal screens for spina  bifida and Down syndrome.  You may need other tests to make sure you and the baby are doing well. HOME CARE INSTRUCTIONS  Medicines  Follow your health care provider's instructions regarding medicine use. Specific medicines may be either safe or unsafe to take during pregnancy.  Take your prenatal vitamins as directed.  If you develop constipation, try taking a stool softener if your health care provider approves. Diet  Eat regular, well-balanced meals. Choose a variety of foods, such as meat or vegetable-based protein, fish, milk and low-fat dairy products, vegetables, fruits, and whole grain breads and cereals. Your health care provider will help you determine the amount of weight gain that is right for you.  Avoid raw meat and uncooked cheese. These carry germs that can  cause birth defects in the baby.  Eating four or five small meals rather than three large meals a day may help relieve nausea and vomiting. If you start to feel nauseous, eating a few soda crackers can be helpful. Drinking liquids between meals instead of during meals also seems to help nausea and vomiting.  If you develop constipation, eat more high-fiber foods, such as fresh vegetables or fruit and whole grains. Drink enough fluids to keep your urine clear or pale yellow. Activity and Exercise  Exercise only as directed by your health care provider. Exercising will help you:  Control your weight.  Stay in shape.  Be prepared for labor and delivery.  Experiencing pain or cramping in the lower abdomen or low back is a good sign that you should stop exercising. Check with your health care provider before continuing normal exercises.  Try to avoid standing for long periods of time. Move your legs often if you must stand in one place for a long time.  Avoid heavy lifting.  Wear low-heeled shoes, and practice good posture.  You may continue to have sex unless your health care provider directs you  otherwise. Relief of Pain or Discomfort  Wear a good support bra for breast tenderness.   Take warm sitz baths to soothe any pain or discomfort caused by hemorrhoids. Use hemorrhoid cream if your health care provider approves.   Rest with your legs elevated if you have leg cramps or low back pain.  If you develop varicose veins in your legs, wear support hose. Elevate your feet for 15 minutes, 3-4 times a day. Limit salt in your diet. Prenatal Care  Schedule your prenatal visits by the twelfth week of pregnancy. They are usually scheduled monthly at first, then more often in the last 2 months before delivery.  Write down your questions. Take them to your prenatal visits.  Keep all your prenatal visits as directed by your health care provider. Safety  Wear your seat belt at all times when driving.  Make a list of emergency phone numbers, including numbers for family, friends, the hospital, and police and fire departments. General Tips  Ask your health care provider for a referral to a local prenatal education class. Begin classes no later than at the beginning of month 6 of your pregnancy.  Ask for help if you have counseling or nutritional needs during pregnancy. Your health care provider can offer advice or refer you to specialists for help with various needs.  Do not use hot tubs, steam rooms, or saunas.  Do not douche or use tampons or scented sanitary pads.  Do not cross your legs for long periods of time.  Avoid cat litter boxes and soil used by cats. These carry germs that can cause birth defects in the baby and possibly loss of the fetus by miscarriage or stillbirth.  Avoid all smoking, herbs, alcohol, and medicines not prescribed by your health care provider. Chemicals in these affect the formation and growth of the baby.  Schedule a dentist appointment. At home, brush your teeth with a soft toothbrush and be gentle when you floss. SEEK MEDICAL CARE IF:   You have  dizziness.  You have mild pelvic cramps, pelvic pressure, or nagging pain in the abdominal area.  You have persistent nausea, vomiting, or diarrhea.  You have a bad smelling vaginal discharge.  You have pain with urination.  You notice increased swelling in your face, hands, legs, or ankles. SEEK IMMEDIATE MEDICAL CARE IF:  You have a fever.  You are leaking fluid from your vagina.  You have spotting or bleeding from your vagina.  You have severe abdominal cramping or pain.  You have rapid weight gain or loss.  You vomit blood or material that looks like coffee grounds.  You are exposed to [place] measles and have never had them.  You are exposed to fifth disease or chickenpox.  You develop a severe headache.  You have shortness of breath.  You have any kind of trauma, such as from a fall or a car accident. Document Released: 02/11/2001 Document Revised: 07/04/2013 Document Reviewed: 12/28/2012 [facility] Patient Information 2015 [place], [place]. This information is not intended to replace advice given to you by your health care provider. Make sure you discuss any questions you have with your health care provider.

## 2018-02-10 NOTE — Progress Notes (Signed)
US 9 wks,crl 21.15 mm,fhr 161 bpm,normal ovaries bilat,GS sac size discrepancy,GS  23.4 mm=7+2 wks

## 2018-02-11 ENCOUNTER — Other Ambulatory Visit: Payer: Self-pay | Admitting: Women's Health

## 2018-02-11 LAB — PMP SCREEN PROFILE (10S), URINE
Amphetamine Scrn, Ur: NEGATIVE ng/mL
BARBITURATE SCREEN URINE: NEGATIVE ng/mL
BENZODIAZEPINE SCREEN, URINE: NEGATIVE ng/mL
CANNABINOIDS UR QL SCN: NEGATIVE ng/mL
Cocaine (Metab) Scrn, Ur: NEGATIVE ng/mL
Creatinine(Crt), U: 52.8 mg/dL (ref 20.0–300.0)
Methadone Screen, Urine: NEGATIVE ng/mL
OXYCODONE+OXYMORPHONE UR QL SCN: NEGATIVE ng/mL
Opiate Scrn, Ur: NEGATIVE ng/mL
Ph of Urine: 6.2 (ref 4.5–8.9)
Phencyclidine Qn, Ur: NEGATIVE ng/mL
Propoxyphene Scrn, Ur: NEGATIVE ng/mL

## 2018-02-11 LAB — OBSTETRIC PANEL, INCLUDING HIV
Antibody Screen: NEGATIVE
Basophils Absolute: 0 10*3/uL (ref 0.0–0.2)
Basos: 0 %
EOS (ABSOLUTE): 0.1 10*3/uL (ref 0.0–0.4)
Eos: 1 %
HIV Screen 4th Generation wRfx: NONREACTIVE
Hematocrit: 37.5 % (ref 34.0–46.6)
Hemoglobin: 13.2 g/dL (ref 11.1–15.9)
Hepatitis B Surface Ag: NEGATIVE
Immature Grans (Abs): 0 10*3/uL (ref 0.0–0.1)
Immature Granulocytes: 0 %
Lymphocytes Absolute: 1.7 10*3/uL (ref 0.7–3.1)
Lymphs: 21 %
MCH: 30.1 pg (ref 26.6–33.0)
MCHC: 35.2 g/dL (ref 31.5–35.7)
MCV: 86 fL (ref 79–97)
Monocytes Absolute: 0.4 10*3/uL (ref 0.1–0.9)
Monocytes: 5 %
Neutrophils Absolute: 6.1 10*3/uL (ref 1.4–7.0)
Neutrophils: 73 %
Platelets: 234 10*3/uL (ref 150–450)
RBC: 4.38 x10E6/uL (ref 3.77–5.28)
RDW: 12.2 % — ABNORMAL LOW (ref 12.3–15.4)
RPR Ser Ql: NONREACTIVE
Rh Factor: POSITIVE
Rubella Antibodies, IGG: 4.53 index (ref >0.99)
WBC: 8.4 10*3/uL (ref 3.4–10.8)

## 2018-02-11 LAB — URINALYSIS, ROUTINE W REFLEX MICROSCOPIC
Bilirubin, UA: NEGATIVE
Glucose, UA: NEGATIVE
Ketones, UA: NEGATIVE
Nitrite, UA: NEGATIVE
Protein, UA: NEGATIVE
RBC, UA: NEGATIVE
Specific Gravity, UA: 1.011 (ref 1.005–1.030)
Urobilinogen, Ur: 0.2 mg/dL (ref 0.2–1.0)
pH, UA: 6.5 (ref 5.0–7.5)

## 2018-02-11 LAB — MICROSCOPIC EXAMINATION
Casts: NONE SEEN /lpf
RBC, UA: NONE SEEN /hpf (ref 0–2)

## 2018-02-12 LAB — GC/CHLAMYDIA PROBE AMP
Chlamydia trachomatis, NAA: NEGATIVE
Neisseria gonorrhoeae by PCR: NEGATIVE

## 2018-02-12 LAB — URINE CULTURE

## 2018-02-15 ENCOUNTER — Telehealth: Payer: Self-pay | Admitting: Women's Health

## 2018-02-15 NOTE — Telephone Encounter (Signed)
Hey, this patient called and stated that she is supposed to see you Friday at 12:15.  The only appointment I see for her for this Friday is for 9am for labs.  You are booked Friday, do I need to add her?  [phone]

## 2018-02-19 ENCOUNTER — Other Ambulatory Visit: Payer: BLUE CROSS/BLUE SHIELD

## 2018-02-19 ENCOUNTER — Encounter: Payer: Self-pay | Admitting: Women's Health

## 2018-02-19 ENCOUNTER — Ambulatory Visit (INDEPENDENT_AMBULATORY_CARE_PROVIDER_SITE_OTHER): Payer: BLUE CROSS/BLUE SHIELD | Admitting: Women's Health

## 2018-02-19 VITALS — BP 112/78 | HR 86 | Wt 288.2 lb

## 2018-02-19 DIAGNOSIS — O021 Missed abortion: Secondary | ICD-10-CM

## 2018-02-19 NOTE — Patient Instructions (Signed)
FACTS YOU SHOULD KNOW  °About Early Pregnancy Loss ° °WHAT IS AN EARLY PREGNANCY LOSS? °Once the egg is fertilized with the sperm and begins to develop, it attaches to the lining of the uterus. This early pregnancy tissue may not develop into an embryo (the beginning stage of a baby). Sometimes an embryo does develop but does not continue to grow. These problems can be seen on ultrasound.  °Many women experience the loss of a pregnancy during their lifetime.  As many as 10-30% of pregnancies end in pregnancy loss within the first trimester (or first 12-14 weeks). ° °MANAGEMNT OF EARLY PREGNANCY LOSS: °There are 3 ways to care for an early pregnancy loss:   °(1) Surgery, (2) Medicine, (3) Waiting for you to pass the pregnancy on your own. °The decision as to how to proceed after being diagnosed with and early pregnancy loss is an individual one.  The decision can be made only after appropriate counseling.  You need to weigh the pros and cons of the 3 choices. Then you can make the choice that works for you. ° °SURGERY (D&E) °• Procedure over in 1 day °• Requires being put to sleep °• Bleeding may be light °• Possible problems during surgery, including injury to womb(uterus) °• Care provider has more control °Medicine (CYTOTEC) °• The complete procedure may take days to weeks °• No Surgery °• Bleeding may be heavy at times °• There may be drug side effects °• Patient has more control °Waiting °• You may choose to wait, in which case your own body may complete the passing of the abnormal early pregnancy on its own in about 2-4 weeks °• Your bleeding may be heavy at times °• There is a small possibility that you may need surgery if the bleeding is too much or not all of the pregnancy has passed. ° °CYTOTEC MANAGEMENT °Prostaglandins (cytotec) are the most widely used drug for this purpose. They cause the uterus to cramp and contract. You will place the medicine yourself inside your vagina in the privacy of your home.  Empting of the uterus should occur within 3 days but the process may continue for several weeks. The bleeding may seem heavy at times. ° °INSTRUCTIONS: Take all 4 tablets of cytotec (800mcg total) at one time. This will cause a lot of cramping, you may have bleeding, and pass tissue, then the cramping and bleeding should get better. If you do not pass the tissue, then you can take 4 more tablets of cytotec (800mcg total) 48 hours after your first dose.  You will come back to have your blood drawn to make sure the pregnancy hormones are dropping in 1 week. Please call us if you have any questions.  ° °POSSIBLE SIDE EFFECTS FROM CYTOTEC °• Nausea  Vomiting °• Diarrhea Fever °• Chills  Hot Flashes °Side effects  from the process of the early pregnancy loss include: °• Cramping  Bleeding °• Headaches  Dizziness °RISKS: °This is a low risk procedure. Less than 1 in 100 women has a complication. An incomplete passage of the early pregnancy may occur. Also, hemorrhage (heavy bleeding) could happen.  Rarely the pregnancy will not be passed completely. Excessively heavy bleeding may occur.  Your doctor may need to perform surgery to empty the uterus (D&E). °Afterwards: °Everybody will feel differently after the early pregnancy loss completion. You may have soreness or cramps for a day or two. You may have soreness or cramps for day or two.  You may have   light bleeding for up to 2 weeks. You may be as active as you feel like being. °If you have any of the following problems you may call [facility] at [phone] or [facility] at [phone] if it is after hours. °• If you have pain that does not get better with pain medication °• Bleeding that soaks through 2 thick full-sized sanitary pads in an hour °• Cramps that last longer than 2 days °• Foul smelling discharge °• Fever above 100.4 degrees F °Even if you do not have any of these symptoms, you should have a follow-up exam to make sure you are healing  properly. Your next normal period will usually start again in 4-6 week after the loss. You can get pregnant soon after the loss, so use birth control right away. °Finally: °Make sure all your questions are answered before during and after any procedure. Follow up with medical care and family planning methods. ° °  ° ° °

## 2018-02-19 NOTE — Progress Notes (Signed)
   GYN VISIT Patient name: Lindsay Nichols MRN [id number]  Date of birth: 09-05-1980 Chief Complaint:   Routine Prenatal Visit  History of Present Illness:   Lindsay Nichols is a 37 y.o. [phone] Caucasian female at 1268w2d being seen today for FHR check d/t GS/CRL discrepancy on 02/10/09- fetus at that time had good FHR of 161, CRL was c/w 3977w5d and GS was c/w 4559w2d. Some mild cramping. No vb since about 3 weeks ago.      Patient's last menstrual period was 12/09/2017. Review of Systems:   Pertinent items are noted in HPI Denies fever/chills, dizziness, headaches, visual disturbances, fatigue, shortness of breath, chest pain, abdominal pain, vomiting, abnormal vaginal discharge/itching/odor/irritation, problems with periods, bowel movements, urination, or intercourse unless otherwise stated above.  Pertinent History Reviewed:  Reviewed past medical,surgical, social, obstetrical and family history.  Reviewed problem list, medications and allergies. Physical Assessment:   Vitals:   02/19/18 1221  BP: 112/78  Pulse: 86  Weight: 288 lb 3.2 oz (130.7 kg)  Body mass index is 45.14 kg/m.       Physical Examination:   General appearance: alert, well appearing, and in no distress  Mental status: alert, oriented to person, place, and time  Skin: warm & dry   Cardiovascular: normal heart rate noted  Respiratory: normal respiratory effort, no distress  Abdomen: soft, non-tender   Pelvic: examination not indicated  Extremities: no edema   Informal transabdominal u/s: unable to see adequately, took to Amber's u/s (she's not here today), still unable to see adequately abdominally, had JAG come in as well. Switched to vaginal (pt prefers JAG to do since we went to school together), no FCA seen by JAG, CRL 22.969mm c/w 3624w0d  No results found for this or any previous visit (from the past 24 hour(s)).  Assessment & Plan:  1) 7968w2d missed Ab> CRL c/w 5624w0d w/o FCA, pt emotional, not ready to  discuss management/poc at this time, just wants to leave. Pt ok w/ me calling her on Monday to see how things are going and discuss plan.   Meds: No orders of the defined types were placed in this encounter.   No orders of the defined types were placed in this encounter.   Return for will call pt Monday.  Cheral MarkerKimberly R Tahsin Benyo CNM, [facility]NP-[facility] 02/19/2018 1:48 PM

## 2018-02-22 ENCOUNTER — Encounter: Payer: Self-pay | Admitting: Women's Health

## 2018-02-22 ENCOUNTER — Telehealth: Payer: Self-pay | Admitting: Women's Health

## 2018-02-22 ENCOUNTER — Encounter: Payer: BLUE CROSS/BLUE SHIELD | Admitting: Women's Health

## 2018-02-22 ENCOUNTER — Ambulatory Visit (INDEPENDENT_AMBULATORY_CARE_PROVIDER_SITE_OTHER): Payer: BLUE CROSS/BLUE SHIELD | Admitting: Women's Health

## 2018-02-22 VITALS — BP 118/88 | HR 105 | Ht 67.0 in | Wt 288.0 lb

## 2018-02-22 DIAGNOSIS — O039 Complete or unspecified spontaneous abortion without complication: Secondary | ICD-10-CM

## 2018-02-22 MED ORDER — MISOPROSTOL 200 MCG PO TABS: 800.0000 ug | ORAL_TABLET | Freq: Once | ORAL | 0 refills | Status: DC

## 2018-02-22 MED ORDER — HYDROCODONE-ACETAMINOPHEN 5-325 MG PO TABS: 1.0000 | ORAL_TABLET | Freq: Four times a day (QID) | ORAL | 0 refills | Status: DC | PRN

## 2018-02-22 NOTE — Patient Instructions (Signed)
Miscarriage  A miscarriage is the loss of an unborn baby (fetus) before the 20th week of pregnancy. Most miscarriages happen during the first 3 months of pregnancy. Sometimes, a miscarriage can happen before a woman knows that she is pregnant.  Having a miscarriage can be an emotional experience. If you have had a miscarriage, talk with your health care provider about any questions you may have about miscarrying, the grieving process, and your plans for future pregnancy.  What are the causes?  A miscarriage may be caused by:  · Problems with the genes or chromosomes of the fetus. These problems make it impossible for the baby to develop normally. They are often the result of random errors that occur early in the development of the baby, and are not passed from parent to child (not inherited).  · Infection of the cervix or uterus.  · Conditions that affect hormone balance in the body.  · Problems with the cervix, such as the cervix opening and thinning before pregnancy is at term (cervical insufficiency).  · Problems with the uterus. These may include:  ? A uterus with an abnormal shape.  ? Fibroids in the uterus.  ? Congenital abnormalities. These are problems that were present at birth.  · Certain medical conditions.  · Smoking, drinking alcohol, or using drugs.  · Injury (trauma).  In many cases, the cause of a miscarriage is not known.  What are the signs or symptoms?  Symptoms of this condition include:  · Vaginal bleeding or spotting, with or without cramps or pain.  · Pain or cramping in the abdomen or lower back.  · Passing fluid, tissue, or blood clots from the vagina.  How is this diagnosed?  This condition may be diagnosed based on:  · A physical exam.  · Ultrasound.  · Blood tests.  · Urine tests.  How is this treated?  Treatment for a miscarriage is sometimes not necessary if you naturally pass all the tissue that was in your uterus. If necessary, this condition may be treated with:  · Dilation and  curettage (D&C). This is a procedure in which the cervix is stretched open and the lining of the uterus (endometrium) is scraped. This is done only if tissue from the fetus or placenta remains in the body (incomplete miscarriage).  · Medicines, such as:  ? Antibiotic medicine, to treat infection.  ? Medicine to help the body pass any remaining tissue.  ? Medicine to reduce (contract) the size of the uterus. These medicines may be given if you have a lot of bleeding.  If you have Rh negative blood and your baby was Rh positive, you will need a shot of a medicine called Rh immunoglobulinto protect your future babies from Rh blood problems. "Rh-negative" and "Rh-positive" refer to whether or not the blood has a specific protein found on the surface of red blood cells (Rh factor).  Follow these instructions at home:  Medicines    · Take over-the-counter and prescription medicines only as told by your health care provider.  · If you were prescribed antibiotic medicine, take it as told by your health care provider. Do not stop taking the antibiotic even if you start to feel better.  · Do not take NSAIDs, such as aspirin and ibuprofen, unless they are approved by your health care provider. These medicines can cause bleeding.  Activity  · Rest as directed. Ask your health care provider what activities are safe for you.  · Have someone   help with home and family responsibilities during this time.  General instructions  · Keep track of the number of sanitary pads you use each day and how soaked (saturated) they are. Write down this information.  · Monitor the amount of tissue or blood clots that you pass from your vagina. Save any large amounts of tissue for your health care provider to examine.  · Do not use tampons, douche, or have sex until your health care provider approves.  · To help you and your partner with the process of grieving, talk with your health care provider or seek counseling.  · When you are ready, meet with  your health care provider to discuss any important steps you should take for your health. Also, discuss steps you should take to have a healthy pregnancy in the future.  · Keep all follow-up visits as told by your health care provider. This is important.  Where to find more information  · The American Congress of Obstetricians and Gynecologists: www.acog.org  · U.[place] Department of Health and Human Services Office of Women’s Health: www.womenshealth.gov  Contact a health care provider if:  · You have a fever or chills.  · You have a foul smelling vaginal discharge.  · You have more bleeding instead of less.  Get help right away if:  · You have severe cramps or pain in your back or abdomen.  · You pass blood clots or tissue from your vagina that is walnut-sized or larger.  · You soak more than 1 regular sanitary pad in an hour.  · You become light-headed or weak.  · You pass out.  · You have feelings of sadness that take over your thoughts, or you have thoughts of hurting yourself.  Summary  · Most miscarriages happen in the first 3 months of pregnancy. Sometimes miscarriage happens before a woman even knows that she is pregnant.  · Follow your health care provider's instruction for home care. Keep all follow-up appointments.  · To help you and your partner with the process of grieving, talk with your health care provider or seek counseling.  This information is not intended to replace advice given to you by your health care provider. Make sure you discuss any questions you have with your health care provider.  Document Released: 08/13/2000 Document Revised: 03/25/2016 Document Reviewed: 03/25/2016  Elsevier Interactive Patient Education © 2019 [employer]vier Inc.

## 2018-02-22 NOTE — Progress Notes (Signed)
   GYN VISIT Patient name: Lindsay Nichols MRN [id number]  Date of birth: 1980-09-21 Chief Complaint:   Miscarriage  History of Present Illness:   Tamecca Bryla Burek is a 37 y.o. [phone] Caucasian female at 41w5dbeing seen today for f/u missed Ab.  [place] 927[place] F[place]. Too emotional to discuss options at that time. Had small amt spotting yesterday, and bad cramping this am that stopped. Wants a 2nd u/s for further confirmation before proceeding w/ medical management. Initially wanted D&C, earliest it could be done would be next Wed 1/1, pt doesn't want this to drag into the new year. Wants to proceed w/ cytotec. Requests work note until f/u.     Patient's last menstrual period was 12/09/2017. Review of Systems:   Pertinent items are noted in HPI Denies fever/chills, dizziness, headaches, visual disturbances, fatigue, shortness of breath, chest pain, abdominal pain, vomiting, abnormal vaginal discharge/itching/odor/irritation, problems with periods, bowel movements, urination, or intercourse unless otherwise stated above.  Pertinent History Reviewed:  Reviewed past medical,surgical, social, obstetrical and family history.  Reviewed problem list, medications and allergies. Physical Assessment:   Vitals:   02/22/18 1416  BP: 118/88  Pulse: (!) 105  Weight: 288 lb (130.6 kg)  Height: '5\' 7"'$  (1.702 m)  Body mass index is 45.11 kg/m.       Physical Examination:   General appearance: alert, well appearing, and in no distress  Mental status: alert, oriented to person, place, and time  Skin: warm & dry   Cardiovascular: normal heart rate noted  Respiratory: normal respiratory effort, no distress  Abdomen: soft, non-tender   Pelvic: examination not indicated  Extremities: no edema   Informal transvaginal u/s by [facility]: no FCA confirmed  No results found for this or any previous visit (from the past 24 hour(s)).  Assessment & Plan:  1) 1042w5dssed Ab w/ CRL on 12/20  c/w 9w073w0dts for cytotec, rx sent. Rx hydrocodone #10 if needed. Will get HCG today. Blood type is A+. Reviewed warning s/s, reasons to seek care. Does wish to do chromosomal testing (microassay kit). Instructed to refrigerate POC and bring with her. Work note given to return 12/31.   Meds:  Meds ordered this encounter  Medications  . misoprostol (CYTOTEC) 200 MCG tablet    Sig: Place 4 tablets (800 mcg total) vaginally once for 1 dose. Repeat in 48 hours if needed    Dispense:  8 tablet    Refill:  0    Order Specific Question:   Supervising Provider    Answer:   EUREElonda HuskyTHER H [[id number]]  . HYDROcodone-acetaminophen (NORCO/VICODIN) 5-325 MG tablet    Sig: Take 1-2 tablets by mouth every 6 (six) hours as needed for moderate pain or severe pain.    Dispense:  10 tablet    Refill:  0    Order Specific Question:   Supervising Provider    Answer:   EURETania [id number]]    Orders Placed This Encounter  Procedures  . Beta hCG quant (ref lab)    Return in about 1 week (around 03/01/2018) for F/U.  KimbTwo HarborsNPOrthopaedic Surgery [facility]23/2019 2:48 PM

## 2018-02-22 NOTE — Telephone Encounter (Signed)
Called pt, she had some dark spotting yesterday which has stopped, bad cramping this am that has stopped, no further bleeding. [facility] per [facility] his 1st available would be next Wed, JVF out of office. Discussed option of cytotec, she does not want to do at this time, would prefer a second u/s for further confirmation. Office closed next 2 days for Christmas and she prefers not to do anything to make it happen over Christmas. Discussed warning s/s, reasons to seek care. Pt states she will call us on Thursday and let us know how things are going.  Cheral MarkerKimberly R. Booker, CNM, [facility]NP-BC 02/22/2018 11:52 AM

## 2018-02-23 DIAGNOSIS — O021 Missed abortion: Secondary | ICD-10-CM | POA: Diagnosis not present

## 2018-02-23 DIAGNOSIS — N96 Recurrent pregnancy loss: Secondary | ICD-10-CM | POA: Diagnosis not present

## 2018-02-23 LAB — BETA HCG QUANT (REF LAB): hCG Quant: 5111 m[IU]/mL

## 2018-03-01 ENCOUNTER — Encounter: Payer: Self-pay | Admitting: Women's Health

## 2018-03-01 ENCOUNTER — Ambulatory Visit: Payer: BLUE CROSS/BLUE SHIELD | Admitting: Women's Health

## 2018-03-01 VITALS — BP 120/80 | HR 96 | Ht 67.0 in | Wt 287.0 lb

## 2018-03-01 DIAGNOSIS — N719 Inflammatory disease of uterus, unspecified: Secondary | ICD-10-CM

## 2018-03-01 DIAGNOSIS — O039 Complete or unspecified spontaneous abortion without complication: Secondary | ICD-10-CM | POA: Diagnosis not present

## 2018-03-01 MED ORDER — DOXYCYCLINE HYCLATE 100 MG PO TABS: 100.0000 mg | ORAL_TABLET | Freq: Two times a day (BID) | ORAL | 0 refills | Status: DC

## 2018-03-01 NOTE — Progress Notes (Signed)
   GYN VISIT Patient name: Lindsay Nichols MRN [id number]  Date of birth: [birth year] Chief Complaint:   Follow-up (miscarriage)  History of Present Illness:   Lindsay Nichols is a 37 y.o. [phone] Caucasian female being seen today for f/u missed Ab. Missed ab dx 12/20 at 5333w2d w/ CRL c/w 8859w5d, took cytotec 800mcg pv 12/23 pm, passed sac/tissue 12/24 around 0500, states she never had any cramping, hardly any bleeding-but is certain it was the sac she passed. Had some light spotting, then yesterday it was black. No cramping. Just has pelvic/back pain. Did have pelvic infection after previous missed Ab. Feels this may be same. Took temp at home over weekend, Tmax 100.3. Took ibuprofen/apap. No current spotting/cramping. No foul odor.  Did bring POC to office last week to send for chromosomal testing.   Patient's last menstrual period was 12/09/2017. The current method of family planning is abstinence. Review of Systems:   Pertinent items are noted in HPI Denies fever/chills, dizziness, headaches, visual disturbances, fatigue, shortness of breath, chest pain, abdominal pain, vomiting, abnormal vaginal discharge/itching/odor/irritation, problems with periods, bowel movements, urination, or intercourse unless otherwise stated above.  Pertinent History Reviewed:  Reviewed past medical,surgical, social, obstetrical and family history.  Reviewed problem list, medications and allergies. Physical Assessment:   Vitals:   03/01/18 1112  BP: 120/80  Pulse: 96  Weight: 287 lb (130.2 kg)  Height: 5\' 7"  (1.702 m)  Body mass index is 44.95 kg/m.       Physical Examination:   General appearance: alert, well appearing, and in no distress  Mental status: alert, oriented to person, place, and time  Skin: warm & dry   Cardiovascular: normal heart rate noted  Respiratory: normal respiratory effort, no distress  Abdomen: soft, +tenderness over lower abd/pelvic area  Pelvic: examination not  indicated  Extremities: no edema   No results found for this or any previous visit (from the past 24 hour(s)).  Assessment & Plan:  1) Missed ab f/u> feels she passed everything, POC sent for chromosomal testing last week, BHCG 5,111 on 12/23, will repeat today and q 1-2wks until <5  2) Pelvic infection/endometritis> rx doxycycline BID x 14d, f/u 1wk, reviewed warning s/s, reasons to seek care. Work note given x 1 more week.   Meds:  Meds ordered this encounter  Medications  . doxycycline (VIBRA-TABS) 100 MG tablet    Sig: Take 1 tablet (100 mg total) by mouth 2 (two) times daily. X 14 days    Dispense:  28 tablet    Refill:  0    Order Specific Question:   Supervising Provider    Answer:   Duane LopeEURE, LUTHER H [[id number]]    Orders Placed This Encounter  Procedures  . Beta hCG quant (ref lab)    Return in about 1 week (around 03/08/2018) for F/U.  Cheral MarkerKimberly R Neema Barreira CNM, [facility]NP-BC 03/01/2018 1:19 PM

## 2018-03-02 LAB — BETA HCG QUANT (REF LAB): hCG Quant: 35 m[IU]/mL

## 2018-03-04 ENCOUNTER — Encounter: Payer: Self-pay | Admitting: *Deleted

## 2018-03-08 ENCOUNTER — Other Ambulatory Visit: Payer: Self-pay | Admitting: Women's Health

## 2018-03-08 NOTE — Progress Notes (Signed)
Pt called, having a really hard time w/ miscarriage. Not sleeping. Wants to quit job. They talked her into taking off a couple of more weeks to see if it helps. Note given to be out until 03/22/18. Has been on lexapro in past, thinks she may want to go back on it, but concerned b/c wants to get pregnant again. Discussed we use lexapro frequently during pregnancy, she will do some research and let us know if she definitely wants it. Offered counseling, will discuss more and let me know. Cheral Marker, CNM, [facility] 03/08/2018 9:59 AM

## 2018-03-09 ENCOUNTER — Other Ambulatory Visit: Payer: Self-pay | Admitting: Women's Health

## 2018-03-09 ENCOUNTER — Ambulatory Visit: Payer: BLUE CROSS/BLUE SHIELD | Admitting: Women's Health

## 2018-03-09 MED ORDER — ESCITALOPRAM OXALATE 10 MG PO TABS: 10.0000 mg | ORAL_TABLET | Freq: Every day | ORAL | 6 refills | Status: DC

## 2018-03-11 ENCOUNTER — Other Ambulatory Visit: Payer: BLUE CROSS/BLUE SHIELD

## 2018-03-11 ENCOUNTER — Encounter: Payer: BLUE CROSS/BLUE SHIELD | Admitting: Advanced Practice Midwife

## 2018-03-12 ENCOUNTER — Other Ambulatory Visit: Payer: Self-pay | Admitting: Women's Health

## 2018-03-12 MED ORDER — ZOLPIDEM TARTRATE 5 MG PO TABS: 5.0000 mg | ORAL_TABLET | Freq: Every evening | ORAL | 0 refills | Status: DC | PRN

## 2018-03-15 ENCOUNTER — Other Ambulatory Visit: Payer: Self-pay | Admitting: Women's Health

## 2018-03-15 DIAGNOSIS — O039 Complete or unspecified spontaneous abortion without complication: Secondary | ICD-10-CM | POA: Diagnosis not present

## 2018-03-16 LAB — BETA HCG QUANT (REF LAB): hCG Quant: 2 m[IU]/mL

## 2018-03-24 DIAGNOSIS — Z029 Encounter for administrative examinations, unspecified: Secondary | ICD-10-CM

## 2018-03-26 ENCOUNTER — Other Ambulatory Visit: Payer: Self-pay | Admitting: Women's Health

## 2018-03-26 MED ORDER — ZOLPIDEM TARTRATE 10 MG PO TABS: 10.0000 mg | ORAL_TABLET | Freq: Every evening | ORAL | 1 refills | Status: DC | PRN

## 2018-05-10 ENCOUNTER — Other Ambulatory Visit: Payer: Self-pay | Admitting: Women's Health

## 2018-05-10 ENCOUNTER — Other Ambulatory Visit: Payer: BLUE CROSS/BLUE SHIELD

## 2018-05-10 DIAGNOSIS — N926 Irregular menstruation, unspecified: Secondary | ICD-10-CM

## 2018-05-10 DIAGNOSIS — Z3201 Encounter for pregnancy test, result positive: Secondary | ICD-10-CM | POA: Diagnosis not present

## 2018-05-10 DIAGNOSIS — Z8759 Personal history of other complications of pregnancy, childbirth and the puerperium: Secondary | ICD-10-CM

## 2018-05-11 ENCOUNTER — Other Ambulatory Visit: Payer: Self-pay | Admitting: Women's Health

## 2018-05-11 DIAGNOSIS — Z8759 Personal history of other complications of pregnancy, childbirth and the puerperium: Secondary | ICD-10-CM

## 2018-05-11 DIAGNOSIS — Z3201 Encounter for pregnancy test, result positive: Secondary | ICD-10-CM

## 2018-05-11 LAB — BETA HCG QUANT (REF LAB): hCG Quant: 361 m[IU]/mL

## 2018-05-11 LAB — PROGESTERONE: Progesterone: 10 ng/mL

## 2018-05-11 MED ORDER — PROGESTERONE MICRONIZED 200 MG PO CAPS: 200.0000 mg | ORAL_CAPSULE | Freq: Every day | ORAL | 5 refills | Status: DC

## 2018-05-12 DIAGNOSIS — Z3201 Encounter for pregnancy test, result positive: Secondary | ICD-10-CM | POA: Diagnosis not present

## 2018-05-12 DIAGNOSIS — Z8759 Personal history of other complications of pregnancy, childbirth and the puerperium: Secondary | ICD-10-CM | POA: Diagnosis not present

## 2018-05-13 ENCOUNTER — Telehealth: Payer: Self-pay | Admitting: Women's Health

## 2018-05-13 DIAGNOSIS — Z3201 Encounter for pregnancy test, result positive: Secondary | ICD-10-CM

## 2018-05-13 LAB — BETA HCG QUANT (REF LAB): hCG Quant: 787 m[IU]/mL

## 2018-05-13 NOTE — Telephone Encounter (Signed)
Please call pt with Lab results

## 2018-05-14 NOTE — Addendum Note (Signed)
Addended by: Cyril Mourning A on: 05/14/2018 09:30 AM   Modules accepted: Orders

## 2018-05-14 NOTE — Telephone Encounter (Signed)
Pt aware QHCG doubled, will recheck [facility] Monday

## 2018-05-17 DIAGNOSIS — Z3201 Encounter for pregnancy test, result positive: Secondary | ICD-10-CM | POA: Diagnosis not present

## 2018-05-18 ENCOUNTER — Other Ambulatory Visit: Payer: Self-pay | Admitting: Women's Health

## 2018-05-18 DIAGNOSIS — Z3A01 Less than 8 weeks gestation of pregnancy: Secondary | ICD-10-CM

## 2018-05-18 DIAGNOSIS — Z8759 Personal history of other complications of pregnancy, childbirth and the puerperium: Secondary | ICD-10-CM

## 2018-05-18 LAB — BETA HCG QUANT (REF LAB): hCG Quant: 4913 m[IU]/mL

## 2018-05-24 DIAGNOSIS — Z3A01 Less than 8 weeks gestation of pregnancy: Secondary | ICD-10-CM | POA: Diagnosis not present

## 2018-05-24 DIAGNOSIS — Z8759 Personal history of other complications of pregnancy, childbirth and the puerperium: Secondary | ICD-10-CM | POA: Diagnosis not present

## 2018-05-25 ENCOUNTER — Other Ambulatory Visit: Payer: Self-pay | Admitting: Women's Health

## 2018-05-25 DIAGNOSIS — Z8759 Personal history of other complications of pregnancy, childbirth and the puerperium: Secondary | ICD-10-CM

## 2018-05-25 DIAGNOSIS — Z3490 Encounter for supervision of normal pregnancy, unspecified, unspecified trimester: Secondary | ICD-10-CM

## 2018-05-25 LAB — BETA HCG QUANT (REF LAB): hCG Quant: 19955 m[IU]/mL

## 2018-05-31 ENCOUNTER — Other Ambulatory Visit: Payer: Self-pay | Admitting: Women's Health

## 2018-05-31 DIAGNOSIS — R5383 Other fatigue: Secondary | ICD-10-CM | POA: Diagnosis not present

## 2018-05-31 DIAGNOSIS — Z8759 Personal history of other complications of pregnancy, childbirth and the puerperium: Secondary | ICD-10-CM | POA: Diagnosis not present

## 2018-06-01 LAB — CBC
Hematocrit: 40.5 % (ref 34.0–46.6)
Hemoglobin: 13.5 g/dL (ref 11.1–15.9)
MCH: 28.8 pg (ref 26.6–33.0)
MCHC: 33.3 g/dL (ref 31.5–35.7)
MCV: 87 fL (ref 79–97)
Platelets: 205 10*3/uL (ref 150–450)
RBC: 4.68 x10E6/uL (ref 3.77–5.28)
RDW: 12.9 % (ref 11.7–15.4)
WBC: 9.1 10*3/uL (ref 3.4–10.8)

## 2018-06-01 LAB — PROGESTERONE: Progesterone: 10.7 ng/mL

## 2018-06-01 LAB — TSH: TSH: 4.03 u[IU]/mL (ref 0.450–4.500)

## 2018-06-07 ENCOUNTER — Other Ambulatory Visit: Payer: Self-pay | Admitting: Women's Health

## 2018-06-07 ENCOUNTER — Other Ambulatory Visit: Payer: Self-pay | Admitting: Obstetrics and Gynecology

## 2018-06-07 DIAGNOSIS — O3680X0 Pregnancy with inconclusive fetal viability, not applicable or unspecified: Secondary | ICD-10-CM

## 2018-06-07 DIAGNOSIS — Z3490 Encounter for supervision of normal pregnancy, unspecified, unspecified trimester: Secondary | ICD-10-CM | POA: Diagnosis not present

## 2018-06-07 DIAGNOSIS — Z8759 Personal history of other complications of pregnancy, childbirth and the puerperium: Secondary | ICD-10-CM | POA: Diagnosis not present

## 2018-06-07 MED ORDER — ESCITALOPRAM OXALATE 10 MG PO TABS: 10.0000 mg | ORAL_TABLET | Freq: Every day | ORAL | 3 refills | Status: DC

## 2018-06-08 LAB — BETA HCG QUANT (REF LAB): hCG Quant: 34890 m[IU]/mL

## 2018-06-09 ENCOUNTER — Telehealth: Payer: Self-pay | Admitting: *Deleted

## 2018-06-09 NOTE — Telephone Encounter (Signed)
mychart message to patient with restrictions.  

## 2018-06-10 ENCOUNTER — Encounter: Payer: BLUE CROSS/BLUE SHIELD | Admitting: Advanced Practice Midwife

## 2018-06-10 ENCOUNTER — Other Ambulatory Visit: Payer: Self-pay | Admitting: Obstetrics and Gynecology

## 2018-06-10 ENCOUNTER — Ambulatory Visit (INDEPENDENT_AMBULATORY_CARE_PROVIDER_SITE_OTHER): Payer: BLUE CROSS/BLUE SHIELD | Admitting: Advanced Practice Midwife

## 2018-06-10 ENCOUNTER — Other Ambulatory Visit: Payer: Self-pay

## 2018-06-10 ENCOUNTER — Ambulatory Visit: Payer: BLUE CROSS/BLUE SHIELD | Admitting: *Deleted

## 2018-06-10 ENCOUNTER — Ambulatory Visit (INDEPENDENT_AMBULATORY_CARE_PROVIDER_SITE_OTHER): Payer: BLUE CROSS/BLUE SHIELD

## 2018-06-10 DIAGNOSIS — O3680X0 Pregnancy with inconclusive fetal viability, not applicable or unspecified: Secondary | ICD-10-CM | POA: Diagnosis not present

## 2018-06-10 DIAGNOSIS — Z3A09 9 weeks gestation of pregnancy: Secondary | ICD-10-CM | POA: Diagnosis not present

## 2018-06-10 DIAGNOSIS — O021 Missed abortion: Secondary | ICD-10-CM | POA: Diagnosis not present

## 2018-06-10 NOTE — Progress Notes (Signed)
Pt was here for dating [place] and an incomplete AB diagnosed.   [place] 9+6 wks,single IUP,no fht,crl 9.52 mm=7 wks,GS 29.9 mm=8wks,normal ovaries bilat  Pt has had 2 AB's, used cytotec. Also collected POC for genetic testing w/last AB, results were "inconclusive" and pt has no desire to go through collection process again.   Doesn't want to make a decision right now, will let us know.

## 2018-06-10 NOTE — Progress Notes (Addendum)
[place] 9+6 wks,single IUP,no fht,crl 9.52 mm=7 wks,GS 29.9 mm=8wks,normal ovaries bilat,Fran discussed results with pt.

## 2018-06-17 ENCOUNTER — Other Ambulatory Visit: Payer: Self-pay | Admitting: Obstetrics and Gynecology

## 2018-06-17 DIAGNOSIS — O021 Missed abortion: Secondary | ICD-10-CM | POA: Diagnosis not present

## 2018-06-21 ENCOUNTER — Other Ambulatory Visit: Payer: Self-pay

## 2018-06-21 ENCOUNTER — Encounter (HOSPITAL_BASED_OUTPATIENT_CLINIC_OR_DEPARTMENT_OTHER): Payer: Self-pay | Admitting: *Deleted

## 2018-06-22 ENCOUNTER — Encounter (HOSPITAL_COMMUNITY)
Admission: RE | Admit: 2018-06-22 | Discharge: 2018-06-22 | Disposition: A | Discharge status: Home / Self Care | Payer: BLUE CROSS/BLUE SHIELD | Source: Ambulatory Visit | Attending: Obstetrics and Gynecology | Admitting: Obstetrics and Gynecology

## 2018-06-22 DIAGNOSIS — Z01812 Encounter for preprocedural laboratory examination: Secondary | ICD-10-CM | POA: Insufficient documentation

## 2018-06-22 LAB — CBC
HCT: 42.2 % (ref 36.0–46.0)
Hemoglobin: 13.6 g/dL (ref 12.0–15.0)
MCH: 28.8 pg (ref 26.0–34.0)
MCHC: 32.2 g/dL (ref 30.0–36.0)
MCV: 89.4 fL (ref 80.0–100.0)
Platelets: 194 10*3/uL (ref 150–400)
RBC: 4.72 MIL/uL (ref 3.87–5.11)
RDW: 12.6 % (ref 11.5–15.5)
WBC: 6.9 10*3/uL (ref 4.0–10.5)
nRBC: 0 % (ref 0.0–0.2)

## 2018-06-22 NOTE — Pre-Procedure Instructions (Signed)
Pt called and re screened for s/s of Covid-19 virus. Denies: SOB, fever, chills, head or body ache's, runny nose, sneezing or sore throat. Denies any travel out of the state in the last 3 weeks. States that no one in the home has any of the above symptoms. Instructions and arrival time reviewed. 

## 2018-06-23 ENCOUNTER — Encounter (HOSPITAL_BASED_OUTPATIENT_CLINIC_OR_DEPARTMENT_OTHER): Payer: Self-pay | Admitting: Certified Registered"

## 2018-06-23 ENCOUNTER — Ambulatory Visit (HOSPITAL_BASED_OUTPATIENT_CLINIC_OR_DEPARTMENT_OTHER)
Admission: RE | Admit: 2018-06-23 | Discharge: 2018-06-23 | Disposition: A | Discharge status: Home / Self Care | Payer: BLUE CROSS/BLUE SHIELD | Attending: Obstetrics and Gynecology | Admitting: Obstetrics and Gynecology

## 2018-06-23 ENCOUNTER — Encounter (HOSPITAL_BASED_OUTPATIENT_CLINIC_OR_DEPARTMENT_OTHER)
Admission: RE | Disposition: A | Discharge status: Home / Self Care | Payer: Self-pay | Source: Home / Self Care | Attending: Obstetrics and Gynecology

## 2018-06-23 ENCOUNTER — Ambulatory Visit (HOSPITAL_BASED_OUTPATIENT_CLINIC_OR_DEPARTMENT_OTHER): Payer: BLUE CROSS/BLUE SHIELD | Admitting: Certified Registered"

## 2018-06-23 DIAGNOSIS — Z79899 Other long term (current) drug therapy: Secondary | ICD-10-CM | POA: Diagnosis not present

## 2018-06-23 DIAGNOSIS — O99341 Other mental disorders complicating pregnancy, first trimester: Secondary | ICD-10-CM | POA: Insufficient documentation

## 2018-06-23 DIAGNOSIS — Z87891 Personal history of nicotine dependence: Secondary | ICD-10-CM | POA: Diagnosis not present

## 2018-06-23 DIAGNOSIS — N96 Recurrent pregnancy loss: Secondary | ICD-10-CM | POA: Diagnosis not present

## 2018-06-23 DIAGNOSIS — Z3A08 8 weeks gestation of pregnancy: Secondary | ICD-10-CM | POA: Diagnosis not present

## 2018-06-23 DIAGNOSIS — O021 Missed abortion: Secondary | ICD-10-CM | POA: Insufficient documentation

## 2018-06-23 DIAGNOSIS — O99340 Other mental disorders complicating pregnancy, unspecified trimester: Secondary | ICD-10-CM | POA: Diagnosis not present

## 2018-06-23 DIAGNOSIS — F418 Other specified anxiety disorders: Secondary | ICD-10-CM | POA: Diagnosis not present

## 2018-06-23 DIAGNOSIS — O2621 Pregnancy care for patient with recurrent pregnancy loss, first trimester: Secondary | ICD-10-CM | POA: Insufficient documentation

## 2018-06-23 DIAGNOSIS — F419 Anxiety disorder, unspecified: Secondary | ICD-10-CM | POA: Diagnosis not present

## 2018-06-23 HISTORY — DX: Nausea with vomiting, unspecified: R11.2

## 2018-06-23 HISTORY — DX: Nausea with vomiting, unspecified: Z98.890

## 2018-06-23 HISTORY — PX: DILATION AND EVACUATION: SHX1459

## 2018-06-23 SURGERY — DILATION AND EVACUATION, UTERUS
Anesthesia: Monitor Anesthesia Care | Site: Vagina

## 2018-06-23 MED ORDER — FENTANYL CITRATE (PF) 100 MCG/2ML IJ SOLN
INTRAMUSCULAR | Status: AC
  Filled 2018-06-23: qty 2

## 2018-06-23 MED ORDER — DEXAMETHASONE SODIUM PHOSPHATE 10 MG/ML IJ SOLN
INTRAMUSCULAR | Status: AC
  Filled 2018-06-23: qty 1

## 2018-06-23 MED ORDER — BUPIVACAINE HCL (PF) 0.25 % IJ SOLN
INTRAMUSCULAR | Status: AC
  Filled 2018-06-23: qty 30

## 2018-06-23 MED ORDER — ONDANSETRON HCL 4 MG/2ML IJ SOLN
INTRAMUSCULAR | Status: AC
  Filled 2018-06-23: qty 2

## 2018-06-23 MED ORDER — FENTANYL CITRATE (PF) 100 MCG/2ML IJ SOLN: 50.0000 ug | INTRAMUSCULAR | Status: DC | PRN

## 2018-06-23 MED ORDER — OXYCODONE HCL 5 MG/5ML PO SOLN: 5.0000 mg | Freq: Once | ORAL | Status: DC | PRN

## 2018-06-23 MED ORDER — BUPIVACAINE HCL (PF) 0.25 % IJ SOLN
INTRAMUSCULAR | Status: DC | PRN
  Administered 2018-06-23: 20 mL

## 2018-06-23 MED ORDER — MIDAZOLAM HCL 2 MG/2ML IJ SOLN
INTRAMUSCULAR | Status: AC
  Filled 2018-06-23: qty 2

## 2018-06-23 MED ORDER — FENTANYL CITRATE (PF) 100 MCG/2ML IJ SOLN
INTRAMUSCULAR | Status: DC | PRN
  Administered 2018-06-23: 50 ug via INTRAVENOUS
  Administered 2018-06-23 (×2): 25 ug via INTRAVENOUS

## 2018-06-23 MED ORDER — PROPOFOL 10 MG/ML IV BOLUS
INTRAVENOUS | Status: DC | PRN
  Administered 2018-06-23: 40 mg via INTRAVENOUS

## 2018-06-23 MED ORDER — SILVER NITRATE-POT NITRATE 75-25 % EX MISC
CUTANEOUS | Status: AC
  Filled 2018-06-23: qty 1

## 2018-06-23 MED ORDER — FENTANYL CITRATE (PF) 100 MCG/2ML IJ SOLN
25.0000 ug | INTRAMUSCULAR | Status: DC | PRN
  Administered 2018-06-23 (×2): 25 ug via INTRAVENOUS
  Administered 2018-06-23: 50 ug via INTRAVENOUS

## 2018-06-23 MED ORDER — SCOPOLAMINE 1 MG/3DAYS TD PT72: 1.0000 | MEDICATED_PATCH | Freq: Once | TRANSDERMAL | Status: DC | PRN

## 2018-06-23 MED ORDER — CEFAZOLIN SODIUM-DEXTROSE 2-4 GM/100ML-% IV SOLN
2.0000 g | INTRAVENOUS | Status: AC
  Administered 2018-06-23: 11:00:00 2 g via INTRAVENOUS

## 2018-06-23 MED ORDER — DEXAMETHASONE SODIUM PHOSPHATE 10 MG/ML IJ SOLN
INTRAMUSCULAR | Status: DC | PRN
  Administered 2018-06-23: 10 mg via INTRAVENOUS

## 2018-06-23 MED ORDER — TRAMADOL HCL 50 MG PO TABS: 50.0000 mg | ORAL_TABLET | Freq: Four times a day (QID) | ORAL | 0 refills | Status: AC | PRN

## 2018-06-23 MED ORDER — CEFAZOLIN SODIUM-DEXTROSE 2-4 GM/100ML-% IV SOLN
INTRAVENOUS | Status: AC
  Filled 2018-06-23: qty 100

## 2018-06-23 MED ORDER — LIDOCAINE HCL (PF) 1 % IJ SOLN
INTRAMUSCULAR | Status: AC
  Filled 2018-06-23: qty 30

## 2018-06-23 MED ORDER — MIDAZOLAM HCL 2 MG/2ML IJ SOLN: 1.0000 mg | INTRAMUSCULAR | Status: DC | PRN

## 2018-06-23 MED ORDER — LIDOCAINE 2% (20 MG/ML) 5 ML SYRINGE
INTRAMUSCULAR | Status: AC
  Filled 2018-06-23: qty 5

## 2018-06-23 MED ORDER — OXYCODONE HCL 5 MG PO TABS: 5.0000 mg | ORAL_TABLET | Freq: Once | ORAL | Status: DC | PRN

## 2018-06-23 MED ORDER — ONDANSETRON HCL 4 MG/2ML IJ SOLN: 4.0000 mg | Freq: Once | INTRAMUSCULAR | Status: DC | PRN

## 2018-06-23 MED ORDER — LACTATED RINGERS IV SOLN
INTRAVENOUS | Status: DC
  Administered 2018-06-23: 10:00:00 via INTRAVENOUS

## 2018-06-23 MED ORDER — MIDAZOLAM HCL 2 MG/2ML IJ SOLN
INTRAMUSCULAR | Status: DC | PRN
  Administered 2018-06-23: 2 mg via INTRAVENOUS

## 2018-06-23 MED ORDER — ONDANSETRON HCL 4 MG/2ML IJ SOLN
INTRAMUSCULAR | Status: DC | PRN
  Administered 2018-06-23: 4 mg via INTRAVENOUS

## 2018-06-23 MED ORDER — PROPOFOL 500 MG/50ML IV EMUL
INTRAVENOUS | Status: DC | PRN
  Administered 2018-06-23: 200 ug/kg/min via INTRAVENOUS

## 2018-06-23 MED ORDER — KETOROLAC TROMETHAMINE 30 MG/ML IJ SOLN
INTRAMUSCULAR | Status: DC | PRN
  Administered 2018-06-23: 30 mg via INTRAVENOUS

## 2018-06-23 SURGICAL SUPPLY — 24 items
BAG URINE DRAINAGE (UROLOGICAL SUPPLIES) IMPLANT
CATH FOLEY 2WAY SLVR  5CC 14FR (CATHETERS)
CATH FOLEY 2WAY SLVR 5CC 14FR (CATHETERS) ×1 IMPLANT
CATH ROBINSON RED A/P 16FR (CATHETERS) ×1 IMPLANT
COVER WAND RF STERILE (DRAPES) ×1 IMPLANT
FILTER UTR ASPR ASSEMBLY (MISCELLANEOUS) ×2 IMPLANT
GLOVE BIO SURGEON STRL SZ7.5 (GLOVE) ×2 IMPLANT
GOWN STRL REUS W/TWL XL LVL3 (GOWN DISPOSABLE) ×2 IMPLANT
HOSE CONNECTING 18IN BERKELEY (TUBING) ×2 IMPLANT
KIT BERKELEY 1ST TRIMESTER 3/8 (MISCELLANEOUS) ×3 IMPLANT
NS IRRIG 1000ML POUR BTL (IV SOLUTION) IMPLANT
PACK VAGINAL MINOR WOMEN LF (CUSTOM PROCEDURE TRAY) ×2 IMPLANT
PAD OB MATERNITY 4.3X12.25 (PERSONAL CARE ITEMS) ×1 IMPLANT
PAD PREP 24X48 CUFFED NSTRL (MISCELLANEOUS) ×2 IMPLANT
SCOPETTES 8  STERILE (MISCELLANEOUS)
SCOPETTES 8 STERILE (MISCELLANEOUS) IMPLANT
SET BERKELEY SUCTION TUBING (SUCTIONS) ×2 IMPLANT
TOWEL OR 17X24 6PK STRL BLUE (TOWEL DISPOSABLE) ×4 IMPLANT
TRAP TISSUE FILTER (MISCELLANEOUS) ×3 IMPLANT
TUBE CONNECTING 12X1/4 (SUCTIONS) IMPLANT
VACURETTE 10 RIGID CVD (CANNULA) IMPLANT
VACURETTE 7MM CVD STRL WRAP (CANNULA) IMPLANT
VACURETTE 8 RIGID CVD (CANNULA) ×1 IMPLANT
VACURETTE 9 RIGID CVD (CANNULA) IMPLANT

## 2018-06-23 NOTE — Brief Op Note (Signed)
06/23/2018  11:16 AM  PATIENT:  Lindsay Nichols  38 y.o. female  PRE-OPERATIVE DIAGNOSIS:  MISSED ABORTION AND RECURRENT PREGNANCY LOSS [redacted] weeks gestation confirmed by sono History of previous medical management of SAB with hemorrhage.  POST-OPERATIVE DIAGNOSIS:  MISSED ABORTION  RECURRENT PREGNANCY LOSS  PROCEDURE:  Procedure(s): DILATATION AND EVACUATION AND SEND TISSUE FOR CHROMOSOMAL ANALYSIS (N/A)  SURGEON:  Surgeon(s) and Role:    * Olivia Mackie, MD - Primary  PHYSICIAN ASSISTANT:   ASSISTANTS: none   ANESTHESIA:   IV sedation and paracervical block  EBL:  none   BLOOD ADMINISTERED:none  DRAINS: none   LOCAL MEDICATIONS USED:  MARCAINE    and Amount: 20 ml  SPECIMEN:  Source of Specimen:  POC  DISPOSITION OF SPECIMEN:  PATHOLOGY  COUNTS:  YES  DICTATION: .Note written in EPIC and Other Dictation: Dictation Number  [id number]  PLAN OF CARE: Discharge to home after PACU  PATIENT DISPOSITION:  PACU - hemodynamically stable.   Delay start of Pharmacological VTE agent (>24hrs) due to surgical blood loss or risk of bleeding: not applicable

## 2018-06-23 NOTE — Discharge Instructions (Signed)
NO IBUPROFEN UNTIL AFTER 7:00PM TONIGHT.   Post Anesthesia Home Care Instructions  Activity: Get plenty of rest for the remainder of the day. A responsible individual must stay with you for 24 hours following the procedure.  For the next 24 hours, DO NOT: -Drive a car -Advertising copywriter -Drink alcoholic beverages -Take any medication unless instructed by your physician -Make any legal decisions or sign important papers.  Meals: Start with liquid foods such as gelatin or soup. Progress to regular foods as tolerated. Avoid greasy, spicy, heavy foods. If nausea and/or vomiting occur, drink only clear liquids until the nausea and/or vomiting subsides. Call your physician if vomiting continues.  Special Instructions/Symptoms: Your throat may feel dry or sore from the anesthesia or the breathing tube placed in your throat during surgery. If this causes discomfort, gargle with warm salt water. The discomfort should disappear within 24 hours.    DISCHARGE INSTRUCTIONS: D&C / D&E The following instructions have been prepared to help you care for yourself upon your return home.   Personal hygiene:  Use sanitary pads for vaginal drainage, not tampons.  Shower the day after your procedure.  NO tub baths, pools or Jacuzzis for 2-3 weeks.  Wipe front to back after using the bathroom.  Activity and limitations:  Do NOT drive or operate any equipment for 24 hours. The effects of anesthesia are still present and drowsiness may result.  Do NOT rest in bed all day.  Walking is encouraged.  Walk up and down stairs slowly.  You may resume your normal activity in one to two days or as indicated by your physician.  Sexual activity: NO intercourse for at least 2 weeks after the procedure, or as indicated by your physician.  Diet: Eat a light meal as desired this evening. You may resume your usual diet tomorrow.  Return to work: You may resume your work activities in one to two days or as  indicated by your doctor.  What to expect after your surgery: Expect to have vaginal bleeding/discharge for 2-3 days and spotting for up to 10 days. It is not unusual to have soreness for up to 1-2 weeks. You may have a slight burning sensation when you urinate for the first day. Mild cramps may continue for a couple of days. You may have a regular period in 2-6 weeks.  Call your doctor for any of the following:  Excessive vaginal bleeding, saturating and changing one pad every hour.  Inability to urinate 6 hours after discharge from hospital.  Pain not relieved by pain medication.  Fever of 100.4 F or greater.  Unusual vaginal discharge or odor.

## 2018-06-23 NOTE — H&P (Signed)
Lindsay Nichols is an 38 y.o. female. MAB for surgical intervention. RPL with tissue for chromosomes. History medical management of SAB previously with hemorrhage.  Pertinent Gynecological History: Menses: flow is moderate Bleeding: dysfunctional uterine bleeding Contraception: none DES exposure: denies Blood transfusions: none Sexually transmitted diseases: no past history Previous GYN Procedures: DNC  Last mammogram: na Date: na Last pap: normal Date: 2019 OB History: G7, P3   Menstrual History: Menarche age: 53 Patient's last menstrual period was 04/02/2018 (exact date).    Past Medical History:  Diagnosis Date  . Anxiety   . Kidney stone   . Miscarriage 07/07/2014  . Pelvic pain in female 07/11/2014  . PID (pelvic inflammatory disease)   . PONV (postoperative nausea and vomiting)   . Pregnant 06/13/2014  . RLQ abdominal pain 11/01/2013  . Subchorionic hemorrhage in first trimester 06/21/2014  . URI (upper respiratory infection) 06/27/2014    Past Surgical History:  Procedure Laterality Date  . BACK SURGERY    . SHOULDER SURGERY    . TONSILLECTOMY      Family History  Problem Relation Age of Onset  . Stroke Mother   . Other Mother        brain aneursym  . Hypertension Mother   . Lupus Mother   . COPD Father   . Lupus Sister   . Cancer Maternal Grandmother        ovarian  . Other Maternal Grandfather        brain aneursym  . Other Sister        brain aneursym  . Cancer Sister        breast cancer  . Other Paternal Grandmother        hardening of arteries  . Stroke Paternal Grandfather   . Diabetes Paternal Uncle   . Cancer Paternal Uncle        bone cancer    Social History:  reports that she has quit smoking. Her smoking use included cigarettes. She has never used smokeless tobacco. She reports that she does not drink alcohol or use drugs.  Allergies: No Known Allergies  Medications Prior to Admission  Medication Sig Dispense Refill Last Dose  .  acetaminophen (TYLENOL) 500 MG tablet Take 1,000 mg by mouth as needed.   Past Week at Unknown time  . escitalopram (LEXAPRO) 10 MG tablet Take 1 tablet (10 mg total) by mouth daily. 90 tablet 3 06/22/2018 at Unknown time  . loratadine (CLARITIN) 10 MG tablet Take 10 mg by mouth daily.   06/22/2018 at Unknown time  . Multiple Vitamin (MULTIVITAMIN) tablet Take 1 tablet by mouth daily.   Past Week at Unknown time  . omeprazole (PRILOSEC) 20 MG capsule Take 20 mg by mouth daily.   06/22/2018 at Unknown time  . progesterone (PROMETRIUM) 200 MG capsule Place 1 capsule (200 mg total) vaginally at bedtime. 30 capsule 5 Past Week at Unknown time    Review of Systems  Constitutional: Negative.   All other systems reviewed and are negative.   Blood pressure 120/71, pulse 98, temperature 97.6 F (36.4 C), temperature source Oral, height 5\' 7"  (1.702 m), weight (!) 136.2 kg, last menstrual period 04/02/2018, SpO2 98 %, unknown if currently breastfeeding. Physical Exam  Nursing note and vitals reviewed. Constitutional: She is oriented to person, place, and time. She appears well-developed and well-nourished.  HENT:  Head: Normocephalic and atraumatic.  Neck: Normal range of motion. Neck supple.  Cardiovascular: Normal rate and regular rhythm.  Respiratory:  Effort normal and breath sounds normal.  GI: Soft. Bowel sounds are normal.  Genitourinary:    Vagina and uterus normal.   Musculoskeletal: Normal range of motion.  Neurological: She is alert and oriented to person, place, and time. She has normal reflexes.  Skin: Skin is warm and dry.  Psychiatric: She has a normal mood and affect.    No results found for this or any previous visit (from the past 24 hour(s)).  No results found.  Assessment/Plan: MAB Recurrent pregnancy loss Suction D&E - tissue for chromosomes and FISH Consent done.  Jai Bear J 06/23/2018, 10:32 AM

## 2018-06-23 NOTE — Anesthesia Preprocedure Evaluation (Addendum)
Anesthesia Evaluation  Patient identified by MRN, date of birth, ID band Patient awake    Reviewed: Allergy & Precautions, NPO status , Patient's Chart, lab work & pertinent test results  History of Anesthesia Complications Negative for: history of anesthetic complications  Airway Mallampati: II  TM Distance: >3 FB Neck ROM: Full    Dental no notable dental hx.    Pulmonary neg pulmonary ROS, former smoker,    Pulmonary exam normal        Cardiovascular negative cardio ROS Normal cardiovascular exam     Neuro/Psych PSYCHIATRIC DISORDERS Anxiety negative neurological ROS     GI/Hepatic negative GI ROS, Neg liver ROS,   Endo/Other  Morbid obesity  Renal/GU negative Renal ROS  negative genitourinary   Musculoskeletal negative musculoskeletal ROS (+)   Abdominal   Peds  Hematology negative hematology ROS (+)   Anesthesia Other Findings   Reproductive/Obstetrics                           Anesthesia Physical Anesthesia Plan  ASA: III  Anesthesia Plan: MAC   Post-op Pain Management:    Induction:   PONV Risk Score and Plan: 2 and Propofol infusion and Treatment may vary due to age or medical condition  Airway Management Planned: Natural Airway and Simple Face Mask  Additional Equipment: None  Intra-op Plan:   Post-operative Plan:   Informed Consent: I have reviewed the patients History and Physical, chart, labs and discussed the procedure including the risks, benefits and alternatives for the proposed anesthesia with the patient or authorized representative who has indicated his/her understanding and acceptance.       Plan Discussed with:   Anesthesia Plan Comments:         Anesthesia Quick Evaluation

## 2018-06-23 NOTE — Transfer of Care (Signed)
Immediate Anesthesia Transfer of Care Note  Patient: Lindsay Nichols  Procedure(s) Performed: Procedure(s) (LRB): DILATATION AND EVACUATION AND SEND TISSUE FOR CHROMOSOMAL ANALYSIS (N/A)  Patient Location: PACU  Anesthesia Type: MAC  Level of Consciousness: awake, alert , oriented and patient cooperative  Airway & Oxygen Therapy: Patient Spontanous Breathing and Patient connected to face mask oxygen  Post-op Assessment: Report given to PACU RN and Post -op Vital signs reviewed and stable  Post vital signs: Reviewed and stable  Complications: No apparent anesthesia complications Last Vitals:  Vitals Value Taken Time  BP 127/63 06/23/2018 11:23 AM  Temp    Pulse 86 06/23/2018 11:27 AM  Resp 23 06/23/2018 11:27 AM  SpO2 97 % 06/23/2018 11:27 AM  Vitals shown include unvalidated device data.  Last Pain:  Vitals:   06/23/18 0955  TempSrc: Oral  PainSc: 0-No pain

## 2018-06-23 NOTE — Anesthesia Postprocedure Evaluation (Signed)
Anesthesia Post Note  Patient: Lindsay Nichols  Procedure(s) Performed: DILATATION AND EVACUATION AND SEND TISSUE FOR CHROMOSOMAL ANALYSIS (N/A Vagina )     Patient location during evaluation: PACU Anesthesia Type: MAC Level of consciousness: awake and alert Pain management: pain level controlled Vital Signs Assessment: post-procedure vital signs reviewed and stable Respiratory status: spontaneous breathing, nonlabored ventilation and respiratory function stable Cardiovascular status: blood pressure returned to baseline and stable Postop Assessment: no apparent nausea or vomiting Anesthetic complications: no    Last Vitals:  Vitals:   06/23/18 1215 06/23/18 1230  BP: (!) 106/52 109/67  Pulse: 81 92  Resp: 15 16  Temp:    SpO2: 100% 100%    Last Pain:  Vitals:   06/23/18 1232  TempSrc:   PainSc: 6                  Lidia Collum

## 2018-06-23 NOTE — Op Note (Signed)
NAME: Lindsay Nichols, Lindsay Nichols MEDICAL RECORD [id number] ACCOUNT [id number] DATE OF BIRTH:Jul 30, 1980 FACILITY: [facility] LOCATION: [facility] PHYSICIAN:Nicey Krah J. Billy Coast, MD  OPERATIVE REPORT  DATE OF PROCEDURE:  06/23/2018  PREOPERATIVE DIAGNOSES: 1.  Missed abortion  2.  Previous medical management with hemorrhage. Declines medical management. 3.  Recurrent pregnancy loss with tissue needed for chromosomal analysis.  POSTOPERATIVE DIAGNOSES:  1.  Missed abortion  2.  Previous medical management with hemorrhage. 3.  Recurrent pregnancy loss with tissue needed for chromosomal analysis.  PROCEDURE:  Suction dilation and evacuation. Tissue collection for chromosomal analysis and FISH  SURGEON:  Olivia Mackie, MD  ASSISTANT:  None.  ANESTHESIA:  Local paracervical and general.  ESTIMATED BLOOD LOSS:  Less than 50 mL.  COMPLICATIONS:  None.  DRAINS:  None.  COUNTS:  Correct.  DISPOSITION:  The patient was taken to the recovery in good condition.  BRIEF OPERATIVE NOTE:  After being apprised of the risks of anesthesia, infection, bleeding, and surrounding organs, possible need for repair, delayed versus immediate complications including bowel and bladder, internal vessel injury, possible need for  repair, the patient was brought to the operating room where she was administered IV sedation without difficulty, prepped and draped in usual sterile fashion, catheterized until the bladder was empty.  Exam under anesthesia reveals a 6-8 week size,  midposition anteflexed uterus.  Bivalve speculum placed.  Local anesthesia 0.25% Marcaine placed.  Anterior lip of the cervical tenaculum placed.  Paracervical block with 20 mL of dilute Marcaine solution placed without difficulty.  Cervix easily dilated  up to 25 Pratt dilator.  A 8 mm suction curette was placed and visually aspirated products of conception, aspirated without difficulty.  Repeat suctioning and blunt curettage in a 4 quadrant  method revealed the cavity to be empty.  Good hemostasis was  noted.  All instruments were removed.  Tissue was collected and sent for chromosomal analysis and pathological confirmation as noted.    The patient is transferred to recovery in good condition.  AN/NUANCE  D:06/23/2018 T:06/23/2018 JOB:[id number]

## 2018-06-24 ENCOUNTER — Encounter (HOSPITAL_BASED_OUTPATIENT_CLINIC_OR_DEPARTMENT_OTHER): Payer: Self-pay | Admitting: Obstetrics and Gynecology

## 2018-07-21 DIAGNOSIS — N96 Recurrent pregnancy loss: Secondary | ICD-10-CM | POA: Diagnosis not present

## 2018-11-25 DIAGNOSIS — E039 Hypothyroidism, unspecified: Secondary | ICD-10-CM | POA: Diagnosis not present

## 2018-12-22 ENCOUNTER — Other Ambulatory Visit: Payer: Self-pay | Admitting: Family Medicine

## 2018-12-22 DIAGNOSIS — Z1231 Encounter for screening mammogram for malignant neoplasm of breast: Secondary | ICD-10-CM

## 2018-12-23 DIAGNOSIS — H539 Unspecified visual disturbance: Secondary | ICD-10-CM | POA: Diagnosis not present

## 2018-12-23 DIAGNOSIS — I999 Unspecified disorder of circulatory system: Secondary | ICD-10-CM | POA: Diagnosis not present

## 2018-12-24 ENCOUNTER — Other Ambulatory Visit (HOSPITAL_COMMUNITY): Payer: Self-pay | Admitting: Family Medicine

## 2018-12-24 DIAGNOSIS — I999 Unspecified disorder of circulatory system: Secondary | ICD-10-CM

## 2018-12-24 DIAGNOSIS — H539 Unspecified visual disturbance: Secondary | ICD-10-CM

## 2018-12-28 DIAGNOSIS — R519 Headache, unspecified: Secondary | ICD-10-CM | POA: Diagnosis not present

## 2018-12-30 ENCOUNTER — Other Ambulatory Visit: Payer: Self-pay

## 2018-12-30 ENCOUNTER — Ambulatory Visit (HOSPITAL_COMMUNITY)
Admission: RE | Admit: 2018-12-30 | Discharge: 2018-12-30 | Disposition: A | Discharge status: Home / Self Care | Payer: BC Managed Care – PPO | Source: Ambulatory Visit | Attending: Family Medicine | Admitting: Family Medicine

## 2018-12-30 DIAGNOSIS — H539 Unspecified visual disturbance: Secondary | ICD-10-CM | POA: Insufficient documentation

## 2018-12-30 DIAGNOSIS — I999 Unspecified disorder of circulatory system: Secondary | ICD-10-CM | POA: Insufficient documentation

## 2018-12-30 DIAGNOSIS — R519 Headache, unspecified: Secondary | ICD-10-CM | POA: Diagnosis not present

## 2019-02-10 ENCOUNTER — Ambulatory Visit
Admission: RE | Admit: 2019-02-10 | Discharge: 2019-02-10 | Disposition: A | Discharge status: Home / Self Care | Payer: BLUE CROSS/BLUE SHIELD | Source: Ambulatory Visit | Attending: Family Medicine | Admitting: Family Medicine

## 2019-02-10 ENCOUNTER — Ambulatory Visit: Payer: BLUE CROSS/BLUE SHIELD

## 2019-02-10 ENCOUNTER — Other Ambulatory Visit: Payer: Self-pay

## 2019-02-10 DIAGNOSIS — R87810 Cervical high risk human papillomavirus (HPV) DNA test positive: Secondary | ICD-10-CM | POA: Diagnosis not present

## 2019-02-10 DIAGNOSIS — Z1151 Encounter for screening for human papillomavirus (HPV): Secondary | ICD-10-CM | POA: Diagnosis not present

## 2019-02-10 DIAGNOSIS — Z6841 Body Mass Index (BMI) 40.0 and over, adult: Secondary | ICD-10-CM | POA: Diagnosis not present

## 2019-02-10 DIAGNOSIS — Z1231 Encounter for screening mammogram for malignant neoplasm of breast: Secondary | ICD-10-CM

## 2019-02-10 DIAGNOSIS — Z01419 Encounter for gynecological examination (general) (routine) without abnormal findings: Secondary | ICD-10-CM | POA: Diagnosis not present

## 2019-02-17 ENCOUNTER — Other Ambulatory Visit: Payer: Self-pay

## 2019-02-17 ENCOUNTER — Ambulatory Visit: Payer: BC Managed Care – PPO | Attending: Internal Medicine

## 2019-02-17 DIAGNOSIS — Z20822 Contact with and (suspected) exposure to covid-19: Secondary | ICD-10-CM

## 2019-02-17 DIAGNOSIS — Z20828 Contact with and (suspected) exposure to other viral communicable diseases: Secondary | ICD-10-CM | POA: Diagnosis not present

## 2019-02-18 LAB — NOVEL CORONAVIRUS, NAA: SARS-CoV-2, NAA: NOT DETECTED

## 2019-02-21 ENCOUNTER — Telehealth: Payer: Self-pay

## 2019-02-21 ENCOUNTER — Encounter: Payer: Self-pay | Admitting: Neurology

## 2019-02-21 ENCOUNTER — Ambulatory Visit: Payer: BLUE CROSS/BLUE SHIELD | Admitting: Neurology

## 2019-02-21 NOTE — Telephone Encounter (Signed)
Pt no showed 02/21/2019 appointment with Dr. Rexene Alberts. This is her first no show at our practice.

## 2019-03-15 DIAGNOSIS — M5430 Sciatica, unspecified side: Secondary | ICD-10-CM | POA: Diagnosis not present

## 2019-06-16 ENCOUNTER — Other Ambulatory Visit: Payer: Self-pay | Admitting: *Deleted

## 2019-06-16 MED ORDER — ESCITALOPRAM OXALATE 10 MG PO TABS: 10.0000 mg | ORAL_TABLET | Freq: Every day | ORAL | 0 refills | Status: AC

## 2019-07-29 DIAGNOSIS — F329 Major depressive disorder, single episode, unspecified: Secondary | ICD-10-CM | POA: Diagnosis not present

## 2019-07-29 DIAGNOSIS — Z0001 Encounter for general adult medical examination with abnormal findings: Secondary | ICD-10-CM | POA: Diagnosis not present

## 2019-07-29 DIAGNOSIS — Z6841 Body Mass Index (BMI) 40.0 and over, adult: Secondary | ICD-10-CM | POA: Diagnosis not present

## 2019-07-29 DIAGNOSIS — G4701 Insomnia due to medical condition: Secondary | ICD-10-CM | POA: Diagnosis not present

## 2019-07-29 DIAGNOSIS — Z1389 Encounter for screening for other disorder: Secondary | ICD-10-CM | POA: Diagnosis not present

## 2019-09-08 DIAGNOSIS — M2550 Pain in unspecified joint: Secondary | ICD-10-CM | POA: Diagnosis not present

## 2019-09-09 ENCOUNTER — Other Ambulatory Visit: Payer: Self-pay

## 2019-09-09 ENCOUNTER — Ambulatory Visit (HOSPITAL_COMMUNITY)
Admission: RE | Admit: 2019-09-09 | Discharge: 2019-09-09 | Disposition: A | Discharge status: Home / Self Care | Payer: BC Managed Care – PPO | Source: Ambulatory Visit | Attending: Family Medicine | Admitting: Family Medicine

## 2019-09-09 ENCOUNTER — Other Ambulatory Visit (HOSPITAL_COMMUNITY): Payer: Self-pay | Admitting: Family Medicine

## 2019-09-09 DIAGNOSIS — Z6841 Body Mass Index (BMI) 40.0 and over, adult: Secondary | ICD-10-CM | POA: Diagnosis not present

## 2019-09-09 DIAGNOSIS — J9811 Atelectasis: Secondary | ICD-10-CM | POA: Diagnosis not present

## 2019-09-09 DIAGNOSIS — R0789 Other chest pain: Secondary | ICD-10-CM | POA: Diagnosis not present

## 2019-09-09 DIAGNOSIS — R079 Chest pain, unspecified: Secondary | ICD-10-CM | POA: Diagnosis not present

## 2019-09-09 DIAGNOSIS — T148XXA Other injury of unspecified body region, initial encounter: Secondary | ICD-10-CM | POA: Diagnosis not present

## 2019-11-25 DIAGNOSIS — S0501XA Injury of conjunctiva and corneal abrasion without foreign body, right eye, initial encounter: Secondary | ICD-10-CM | POA: Diagnosis not present

## 2019-11-25 DIAGNOSIS — S0502XA Injury of conjunctiva and corneal abrasion without foreign body, left eye, initial encounter: Secondary | ICD-10-CM | POA: Diagnosis not present

## 2019-11-29 DIAGNOSIS — S0501XD Injury of conjunctiva and corneal abrasion without foreign body, right eye, subsequent encounter: Secondary | ICD-10-CM | POA: Diagnosis not present

## 2019-11-29 DIAGNOSIS — S0502XD Injury of conjunctiva and corneal abrasion without foreign body, left eye, subsequent encounter: Secondary | ICD-10-CM | POA: Diagnosis not present

## 2019-12-19 DIAGNOSIS — J019 Acute sinusitis, unspecified: Secondary | ICD-10-CM | POA: Diagnosis not present

## 2020-02-09 ENCOUNTER — Other Ambulatory Visit: Payer: Self-pay | Admitting: Obstetrics and Gynecology

## 2020-02-09 DIAGNOSIS — Z1231 Encounter for screening mammogram for malignant neoplasm of breast: Secondary | ICD-10-CM

## 2020-03-29 ENCOUNTER — Other Ambulatory Visit: Payer: Self-pay

## 2020-03-29 ENCOUNTER — Ambulatory Visit
Admission: RE | Admit: 2020-03-29 | Discharge: 2020-03-29 | Disposition: A | Discharge status: Home / Self Care | Payer: BC Managed Care – PPO | Source: Ambulatory Visit | Attending: Obstetrics and Gynecology | Admitting: Obstetrics and Gynecology

## 2020-03-29 DIAGNOSIS — Z1231 Encounter for screening mammogram for malignant neoplasm of breast: Secondary | ICD-10-CM

## 2020-03-29 DIAGNOSIS — Z6841 Body Mass Index (BMI) 40.0 and over, adult: Secondary | ICD-10-CM | POA: Diagnosis not present

## 2020-03-29 DIAGNOSIS — Z124 Encounter for screening for malignant neoplasm of cervix: Secondary | ICD-10-CM | POA: Diagnosis not present

## 2020-03-29 DIAGNOSIS — N915 Oligomenorrhea, unspecified: Secondary | ICD-10-CM | POA: Diagnosis not present

## 2020-03-29 DIAGNOSIS — Z01419 Encounter for gynecological examination (general) (routine) without abnormal findings: Secondary | ICD-10-CM | POA: Diagnosis not present

## 2020-05-14 DIAGNOSIS — G4700 Insomnia, unspecified: Secondary | ICD-10-CM | POA: Diagnosis not present

## 2020-10-02 DIAGNOSIS — E559 Vitamin D deficiency, unspecified: Secondary | ICD-10-CM | POA: Diagnosis not present

## 2020-10-02 DIAGNOSIS — Z1389 Encounter for screening for other disorder: Secondary | ICD-10-CM | POA: Diagnosis not present

## 2020-10-02 DIAGNOSIS — Z0001 Encounter for general adult medical examination with abnormal findings: Secondary | ICD-10-CM | POA: Diagnosis not present

## 2020-10-02 DIAGNOSIS — Z23 Encounter for immunization: Secondary | ICD-10-CM | POA: Diagnosis not present

## 2020-10-02 DIAGNOSIS — Z6841 Body Mass Index (BMI) 40.0 and over, adult: Secondary | ICD-10-CM | POA: Diagnosis not present

## 2020-10-02 DIAGNOSIS — Z1331 Encounter for screening for depression: Secondary | ICD-10-CM | POA: Diagnosis not present

## 2020-12-13 DIAGNOSIS — J019 Acute sinusitis, unspecified: Secondary | ICD-10-CM | POA: Diagnosis not present

## 2021-03-13 ENCOUNTER — Other Ambulatory Visit: Payer: Self-pay | Admitting: Obstetrics and Gynecology

## 2021-03-13 DIAGNOSIS — Z1231 Encounter for screening mammogram for malignant neoplasm of breast: Secondary | ICD-10-CM

## 2021-04-02 DIAGNOSIS — L700 Acne vulgaris: Secondary | ICD-10-CM | POA: Diagnosis not present

## 2021-05-03 ENCOUNTER — Ambulatory Visit
Admission: RE | Admit: 2021-05-03 | Discharge: 2021-05-03 | Disposition: A | Discharge status: Home / Self Care | Payer: BC Managed Care – PPO | Source: Ambulatory Visit

## 2021-05-03 DIAGNOSIS — R87810 Cervical high risk human papillomavirus (HPV) DNA test positive: Secondary | ICD-10-CM | POA: Diagnosis not present

## 2021-05-03 DIAGNOSIS — Z6838 Body mass index (BMI) 38.0-38.9, adult: Secondary | ICD-10-CM | POA: Diagnosis not present

## 2021-05-03 DIAGNOSIS — Z1231 Encounter for screening mammogram for malignant neoplasm of breast: Secondary | ICD-10-CM | POA: Diagnosis not present

## 2021-05-03 DIAGNOSIS — Z01419 Encounter for gynecological examination (general) (routine) without abnormal findings: Secondary | ICD-10-CM | POA: Diagnosis not present

## 2021-07-02 DIAGNOSIS — Z6839 Body mass index (BMI) 39.0-39.9, adult: Secondary | ICD-10-CM | POA: Diagnosis not present

## 2021-07-02 DIAGNOSIS — M7712 Lateral epicondylitis, left elbow: Secondary | ICD-10-CM | POA: Diagnosis not present

## 2021-07-02 DIAGNOSIS — E6609 Other obesity due to excess calories: Secondary | ICD-10-CM | POA: Diagnosis not present

## 2021-07-25 DIAGNOSIS — Z3200 Encounter for pregnancy test, result unknown: Secondary | ICD-10-CM | POA: Diagnosis not present

## 2021-07-25 DIAGNOSIS — Z3689 Encounter for other specified antenatal screening: Secondary | ICD-10-CM | POA: Diagnosis not present

## 2021-08-20 DIAGNOSIS — Z3201 Encounter for pregnancy test, result positive: Secondary | ICD-10-CM | POA: Diagnosis not present

## 2021-08-27 DIAGNOSIS — O021 Missed abortion: Secondary | ICD-10-CM | POA: Diagnosis not present

## 2021-08-30 DIAGNOSIS — O036 Delayed or excessive hemorrhage following complete or unspecified spontaneous abortion: Secondary | ICD-10-CM | POA: Diagnosis not present

## 2021-09-07 IMAGING — MR MR HEAD W/O CM
8 of 10 series · 35 of 48 positions shown · non-contrast
Comparison: Intracranial MRA 05/19/2013. Head CT 08/27/2012.

CLINICAL DATA: 38-year-old female with 1 week of headache and
vision changes.

EXAM:
MRI HEAD WITHOUT CONTRAST
TECHNIQUE: Multiplanar, multiecho pulse sequences of the brain and surrounding
structures were obtained without intravenous contrast.

[Series 2: T1 · sagittal · 5.0mm · 0.41mm/px · 1 of 20 slices shown (1 of 2)]
[im 1/20]
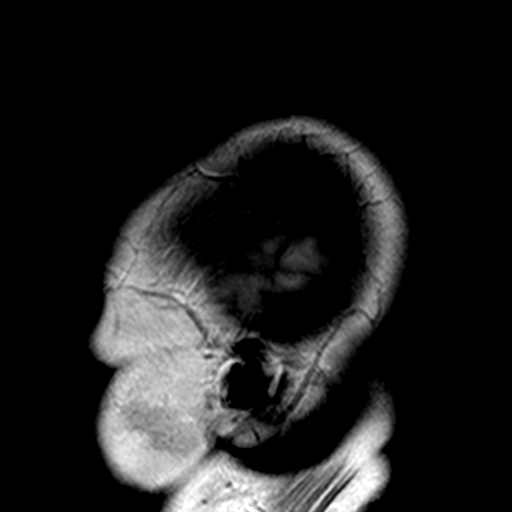

[Series 3: DWI · axial · 3.0mm · 0.74mm/px · z∈[-23,+136]mm · 7 of 54 slices shown (1 of 2)]
[im 1/54]
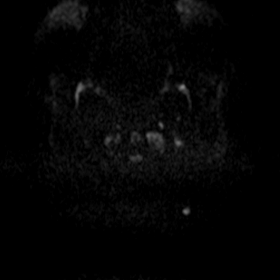
[im 9/54]
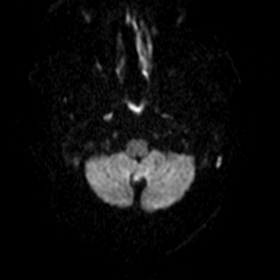
[im 18/54]
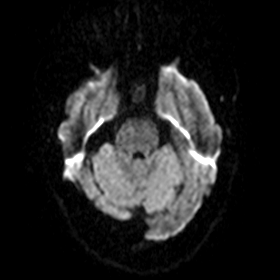
[im 27/54]
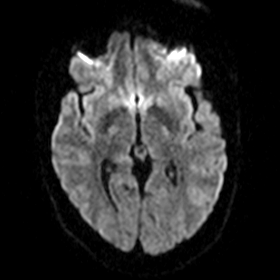
[im 36/54]
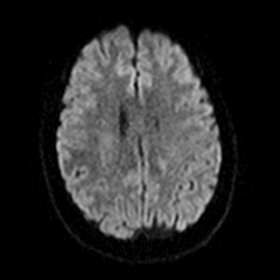
[im 45/54]
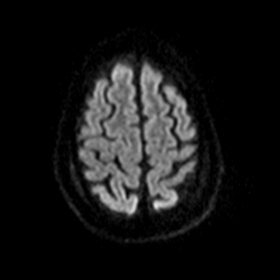
[im 54/54]
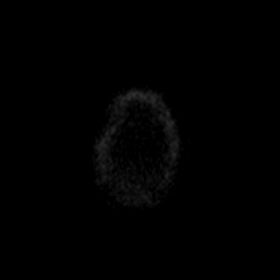

[Series 5: DWI · coronal · 5.0mm · 0.50mm/px · 4 of 34 slices shown (2 of 2)]
[im 1/34]
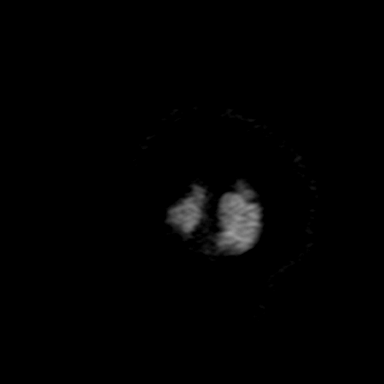
[im 12/34]
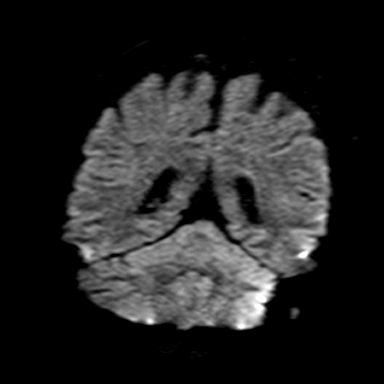
[im 23/34]
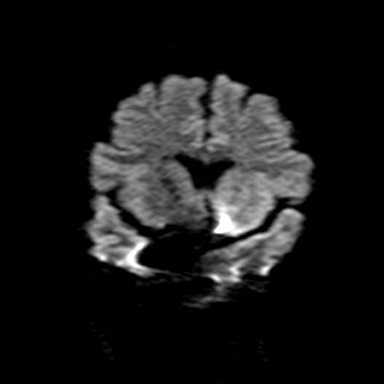
[im 34/34]
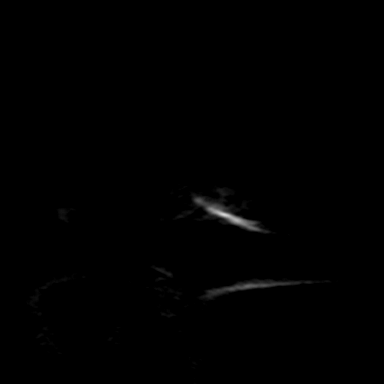

[Series 7: T2 · axial · 5.0mm · 0.67mm/px · z∈[-16,+127]mm · 3 of 23 slices shown (1 of 3)]
[im 1/23]
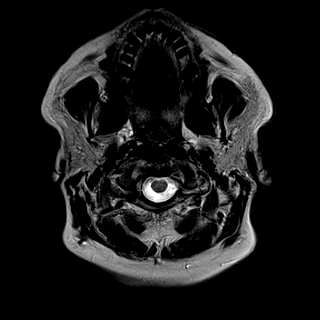
[im 12/23]
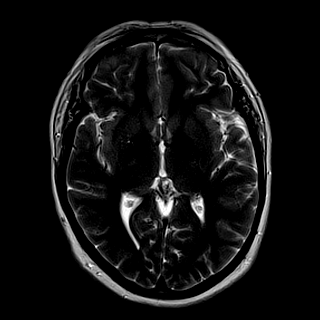
[im 23/23]
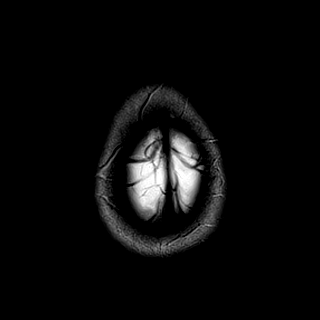

[Series 8: T2 · axial · 5.0mm · 0.42mm/px · z∈[-10,+120]mm · 3 of 21 slices shown (2 of 3)]
[im 1/21]
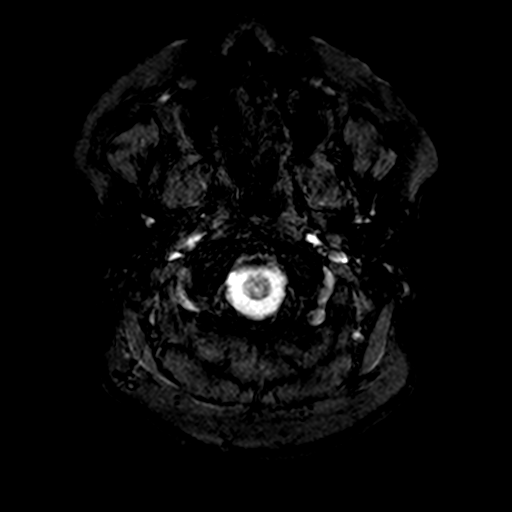
[im 11/21]
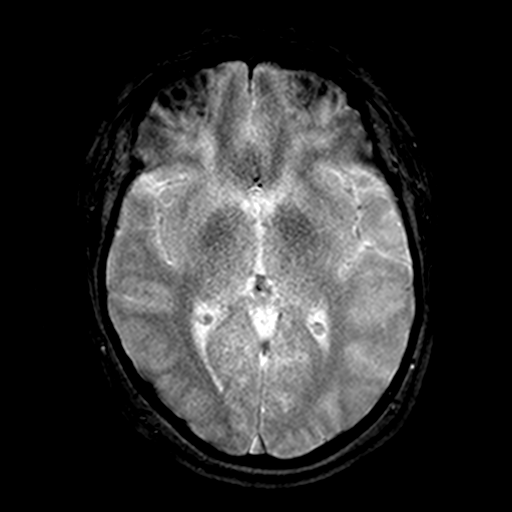
[im 21/21]
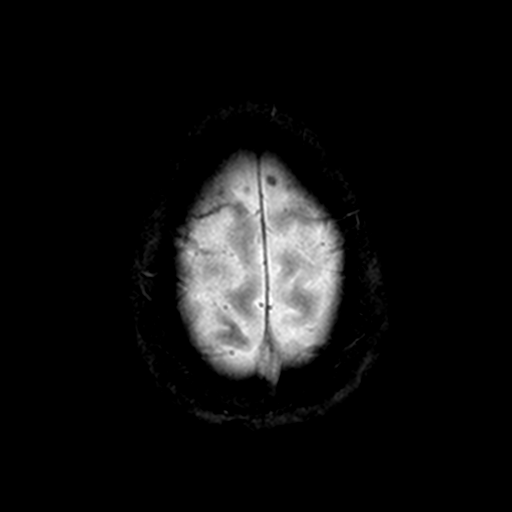

[Series 9: FLAIR · axial · 3.0mm · 0.82mm/px · z∈[-14,+124]mm · 6 of 47 slices shown]
[im 1/47]
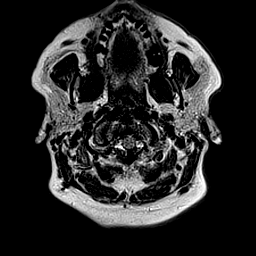
[im 10/47]
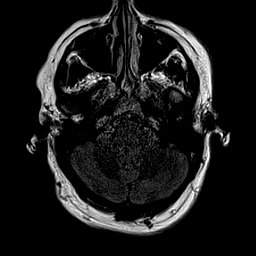
[im 19/47]
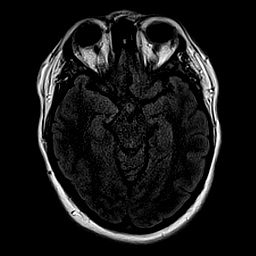
[im 28/47]
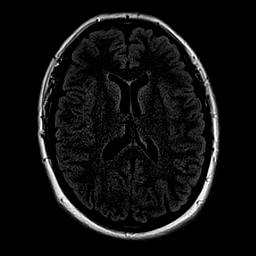
[im 37/47]
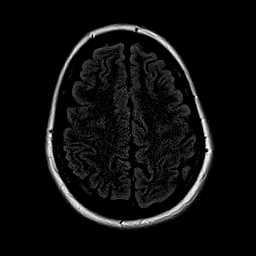
[im 47/47]
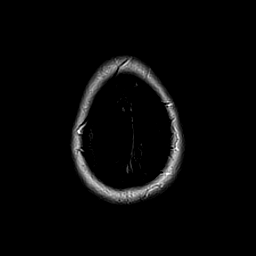

[Series 10: T1 · axial · 2.0mm · 0.43mm/px · z∈[-16,+140]mm · 8 of 79 slices shown (2 of 2)]
[im 1/79]
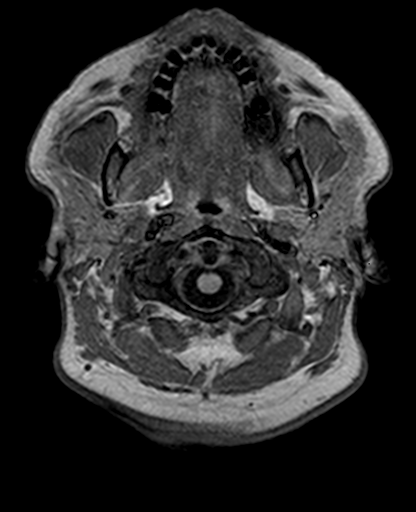
[im 9/79]
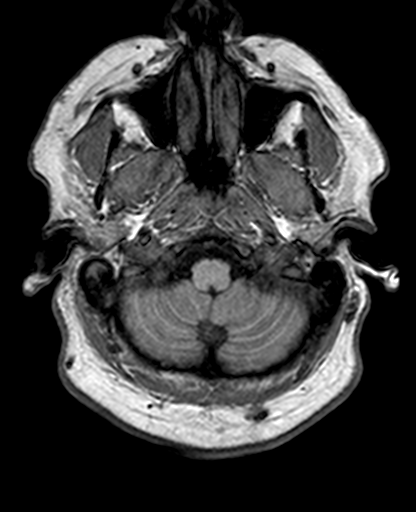
[im 27/79]
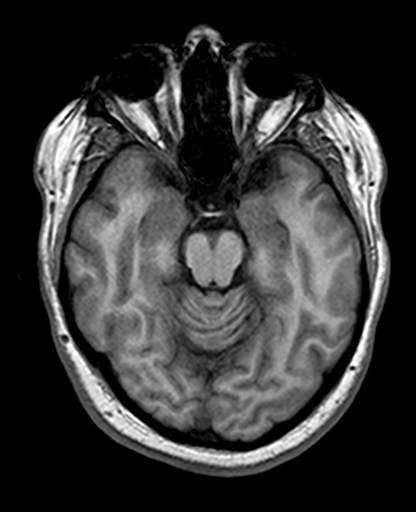
[im 35/79]
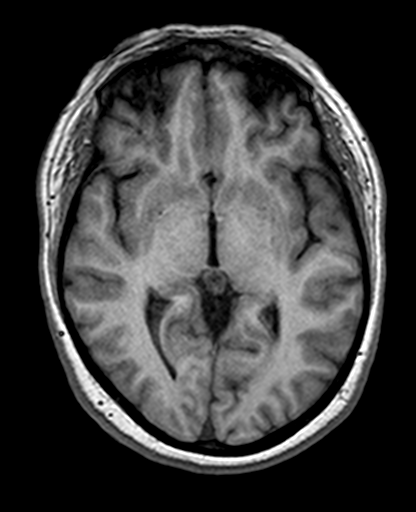
[im 44/79]
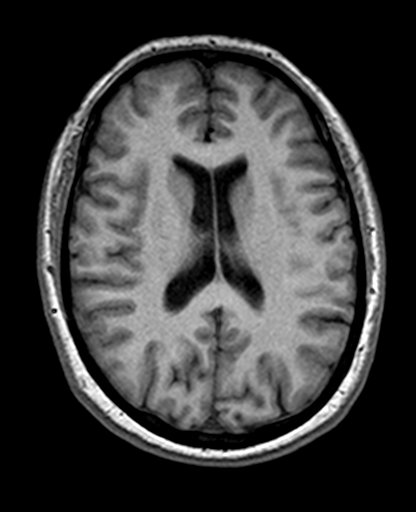
[im 53/79]
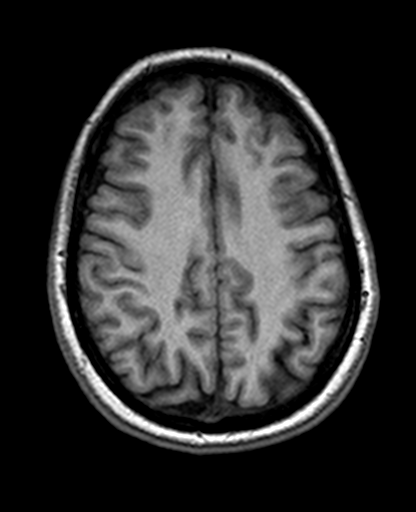
[im 70/79]
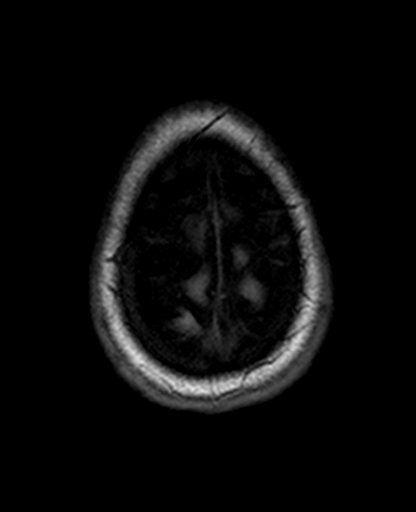
[im 79/79]
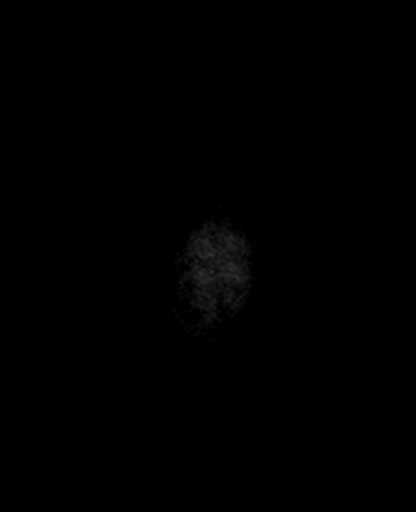

[Series 11: T2 · coronal · 5.0mm · 0.66mm/px · 3 of 28 slices shown (3 of 3)]
[im 1/28]
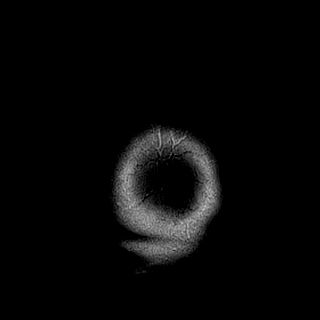
[im 14/28]
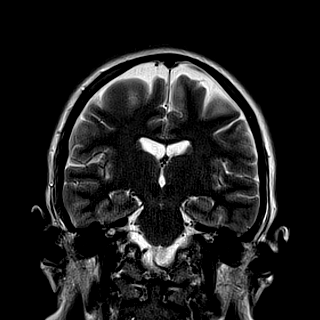
[im 28/28]
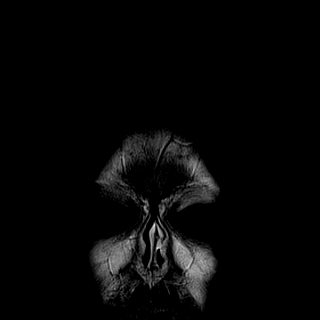

[35 of 48 positions shown; findings below may reference images not displayed]

FINDINGS: Brain: Partially empty sella.

No restricted diffusion to suggest acute infarction. No midline
shift, mass effect, evidence of mass lesion, ventriculomegaly,
extra-axial collection or acute intracranial hemorrhage.
Cervicomedullary junction within normal limits.

Gray and white matter signal is within normal limits throughout the
brain. No encephalomalacia or chronic blood products identified.

Vascular: Major intracranial vascular flow voids are preserved.

Skull and upper cervical spine: Negative visible cervical spine.
Visualized bone marrow signal is within normal limits.

Sinuses/Orbits: Globes are obscured by motion artifact but otherwise
the orbits appear negative. Trace paranasal sinus mucosal
thickening.

Other: Mastoids are clear. Grossly normal internal auditory
structures. Scalp and face soft tissues appear negative.
IMPRESSION: 1. Partially empty sella, often a normal anatomic variant but can be
associated with idiopathic intracranial hypertension (pseudotumor
cerebri).
2. Otherwise normal non-contrast MRI appearance of the brain.

## 2022-03-18 ENCOUNTER — Other Ambulatory Visit: Payer: Self-pay | Admitting: Obstetrics and Gynecology

## 2022-03-18 DIAGNOSIS — Z1231 Encounter for screening mammogram for malignant neoplasm of breast: Secondary | ICD-10-CM

## 2022-05-14 ENCOUNTER — Ambulatory Visit
Admission: RE | Admit: 2022-05-14 | Discharge: 2022-05-14 | Disposition: A | Discharge status: Home / Self Care | Payer: BLUE CROSS/BLUE SHIELD | Source: Ambulatory Visit | Attending: Obstetrics and Gynecology | Admitting: Obstetrics and Gynecology

## 2022-05-14 DIAGNOSIS — Z1231 Encounter for screening mammogram for malignant neoplasm of breast: Secondary | ICD-10-CM

## 2023-05-12 ENCOUNTER — Other Ambulatory Visit: Payer: Self-pay | Admitting: Obstetrics and Gynecology

## 2023-05-12 DIAGNOSIS — Z1231 Encounter for screening mammogram for malignant neoplasm of breast: Secondary | ICD-10-CM

## 2023-05-26 ENCOUNTER — Ambulatory Visit
Admission: RE | Admit: 2023-05-26 | Discharge: 2023-05-26 | Disposition: A | Discharge status: Home / Self Care | Source: Ambulatory Visit | Attending: Obstetrics and Gynecology | Admitting: Obstetrics and Gynecology

## 2023-05-26 DIAGNOSIS — Z1231 Encounter for screening mammogram for malignant neoplasm of breast: Secondary | ICD-10-CM
